# Patient Record
Sex: Female | Born: 1990 | Race: White | Hispanic: No | Marital: Single | Smoking: Never smoker
Health system: Southern US, Community
[De-identification: ages and names within clinical notes are randomized; demographics above are authoritative.]

## PROBLEM LIST (undated history)

## (undated) DIAGNOSIS — D649 Anemia, unspecified: Secondary | ICD-10-CM

## (undated) DIAGNOSIS — D219 Benign neoplasm of connective and other soft tissue, unspecified: Secondary | ICD-10-CM

## (undated) HISTORY — PX: NO PAST SURGERIES: SHX2092

## (undated) HISTORY — DX: Anemia, unspecified: D64.9

---
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
## 2014-11-11 ENCOUNTER — Emergency Department (HOSPITAL_COMMUNITY)
Admission: EM | Admit: 2014-11-11 | Discharge: 2014-11-12 | Disposition: A | Discharge status: Home / Self Care | Payer: Self-pay | Attending: Emergency Medicine | Admitting: Emergency Medicine

## 2014-11-11 ENCOUNTER — Encounter (HOSPITAL_COMMUNITY): Payer: Self-pay | Admitting: Emergency Medicine

## 2014-11-11 DIAGNOSIS — Z8742 Personal history of other diseases of the female genital tract: Secondary | ICD-10-CM | POA: Insufficient documentation

## 2014-11-11 DIAGNOSIS — Z3490 Encounter for supervision of normal pregnancy, unspecified, unspecified trimester: Secondary | ICD-10-CM

## 2014-11-11 DIAGNOSIS — O210 Mild hyperemesis gravidarum: Secondary | ICD-10-CM | POA: Insufficient documentation

## 2014-11-11 DIAGNOSIS — Z3A00 Weeks of gestation of pregnancy not specified: Secondary | ICD-10-CM | POA: Insufficient documentation

## 2014-11-11 DIAGNOSIS — O219 Vomiting of pregnancy, unspecified: Secondary | ICD-10-CM

## 2014-11-11 DIAGNOSIS — R1030 Lower abdominal pain, unspecified: Secondary | ICD-10-CM | POA: Insufficient documentation

## 2014-11-11 HISTORY — DX: Benign neoplasm of connective and other soft tissue, unspecified: D21.9

## 2014-11-11 LAB — PREGNANCY, URINE: Preg Test, Ur: POSITIVE — AB

## 2014-11-11 MED ORDER — ONDANSETRON HCL 4 MG/2ML IJ SOLN
4.0000 mg | Freq: Once | INTRAMUSCULAR | Status: DC
  Filled 2014-11-11: qty 2

## 2014-11-11 NOTE — ED Notes (Signed)
Pelvic supplies at bedside. 

## 2014-11-11 NOTE — ED Notes (Signed)
Patient stated that three days ago she has started feeling severely nausea's. Patient stated that if she does not eat in a certain amount of time that she will get nauseated. Patient says she feel that she rip or tore something in her lower abdomen. She also states that she has a sharp pain there.

## 2014-11-12 LAB — COMPREHENSIVE METABOLIC PANEL
ALT: 14 U/L (ref 0–35)
ANION GAP: 6 (ref 5–15)
AST: 17 U/L (ref 0–37)
Albumin: 3.8 g/dL (ref 3.5–5.2)
Alkaline Phosphatase: 73 U/L (ref 39–117)
BILIRUBIN TOTAL: 0.4 mg/dL (ref 0.3–1.2)
BUN: 11 mg/dL (ref 6–23)
CO2: 22 mmol/L (ref 19–32)
Calcium: 9 mg/dL (ref 8.4–10.5)
Chloride: 105 mmol/L (ref 96–112)
Creatinine, Ser: 0.83 mg/dL (ref 0.50–1.10)
GFR calc Af Amer: >90 mL/min (ref >90)
GFR calc non Af Amer: >90 mL/min (ref >90)
GLUCOSE: 101 mg/dL — AB (ref 70–99)
POTASSIUM: 3.6 mmol/L (ref 3.5–5.1)
Sodium: 133 mmol/L — ABNORMAL LOW (ref 135–145)
Total Protein: 6.8 g/dL (ref 6.0–8.3)

## 2014-11-12 LAB — CBC WITH DIFFERENTIAL/PLATELET
BASOS ABS: 0 10*3/uL (ref 0.0–0.1)
BASOS PCT: 0 % (ref 0–1)
Eosinophils Absolute: 0.2 10*3/uL (ref 0.0–0.7)
Eosinophils Relative: 2 % (ref 0–5)
HCT: 35.5 % — ABNORMAL LOW (ref 36.0–46.0)
Hemoglobin: 11.1 g/dL — ABNORMAL LOW (ref 12.0–15.0)
Lymphocytes Relative: 34 % (ref 12–46)
Lymphs Abs: 2.9 10*3/uL (ref 0.7–4.0)
MCH: 26.2 pg (ref 26.0–34.0)
MCHC: 31.3 g/dL (ref 30.0–36.0)
MCV: 83.7 fL (ref 78.0–100.0)
MONO ABS: 0.6 10*3/uL (ref 0.1–1.0)
MONOS PCT: 7 % (ref 3–12)
NEUTROS ABS: 4.9 10*3/uL (ref 1.7–7.7)
Neutrophils Relative %: 57 % (ref 43–77)
Platelets: 253 10*3/uL (ref 150–400)
RBC: 4.24 MIL/uL (ref 3.87–5.11)
RDW: 15.1 % (ref 11.5–15.5)
WBC: 8.7 10*3/uL (ref 4.0–10.5)

## 2014-11-12 LAB — URINALYSIS, ROUTINE W REFLEX MICROSCOPIC
BILIRUBIN URINE: NEGATIVE
Glucose, UA: NEGATIVE mg/dL
Hgb urine dipstick: NEGATIVE
Ketones, ur: NEGATIVE mg/dL
Nitrite: NEGATIVE
Protein, ur: NEGATIVE mg/dL
Specific Gravity, Urine: 1.024 (ref 1.005–1.030)
Urobilinogen, UA: 0.2 mg/dL (ref 0.0–1.0)
pH: 7.5 (ref 5.0–8.0)

## 2014-11-12 LAB — WET PREP, GENITAL
Trich, Wet Prep: NONE SEEN
Yeast Wet Prep HPF POC: NONE SEEN

## 2014-11-12 LAB — URINE MICROSCOPIC-ADD ON

## 2014-11-12 LAB — HCG, QUANTITATIVE, PREGNANCY: hCG, Beta Chain, Quant, S: 27388 m[IU]/mL — ABNORMAL HIGH (ref <5)

## 2014-11-12 LAB — GC/CHLAMYDIA PROBE AMP (~~LOC~~) NOT AT ARMC
Chlamydia: NEGATIVE
Neisseria Gonorrhea: NEGATIVE

## 2014-11-12 LAB — LIPASE, BLOOD: Lipase: 21 U/L (ref 11–59)

## 2014-11-12 MED ORDER — PRENATAL COMPLETE 14-0.4 MG PO TABS: 1.0000 | ORAL_TABLET | Freq: Every day | ORAL | Status: DC

## 2014-11-12 NOTE — ED Provider Notes (Signed)
CSN: [id number]     Arrival date & time 11/11/14  2151 History   First MD Initiated Contact with Patient 11/11/14 2307     Chief Complaint  Patient presents with  . Abdominal Pain     (Consider location/radiation/quality/duration/timing/severity/associated sxs/prior Treatment) HPI 24 year old female presents to the emergency department with complaints of several months of nausea, worse when she does not eat, constipation and gas.  She reports over the last 3 days.  Nausea has been worse.  She has had some sharp pains in her lower abdomen, which usually relieved by having a bowel movement or passing gas.  Patient reports she has bowel movements every other day to sometimes twice a day.  Stools are formed.  She reports her last menstrual period was December 4.  She is not on birth control.  She is sexually active.  She reports she had a faintly positive pregnancy test about 2 weeks ago.  She does report that she has had some increased urination without burning or pain.  She has slightly more vaginal discharge and she normally does. Past Medical History  Diagnosis Date  . Fibroids    History reviewed. No pertinent past surgical history. History reviewed. No pertinent family history. History  Substance Use Topics  . Smoking status: Never Smoker   . Smokeless tobacco: Never Used  . Alcohol Use: Not on file   OB History    No data available     Review of Systems  See History of Present Illness; otherwise all other systems are reviewed and negative   Allergies  Review of patient's allergies indicates no known allergies.  Home Medications   Prior to Admission medications   Not on File   BP 121/73 mmHg  Pulse 88  Temp(Src) 98.2 F (36.8 C) (Oral)  Resp 18  SpO2 100%  LMP 09/10/2014 (Exact Date) Physical Exam  Constitutional: She is oriented to person, place, and time. She appears well-developed and well-nourished.  HENT:  Head: Normocephalic and atraumatic.  Nose: Nose  normal.  Mouth/Throat: Oropharynx is clear and moist.  Eyes: Conjunctivae and EOM are normal. Pupils are equal, round, and reactive to light.  Neck: Normal range of motion. Neck supple. No JVD present. No tracheal deviation present. No thyromegaly present.  Cardiovascular: Normal rate, regular rhythm, normal heart sounds and intact distal pulses.  Exam reveals no gallop and no friction rub.   No murmur heard. Pulmonary/Chest: Effort normal and breath sounds normal. No stridor. No respiratory distress. She has no wheezes. She has no rales. She exhibits no tenderness.  Abdominal: Soft. Bowel sounds are normal. She exhibits no distension and no mass. There is no tenderness. There is no rebound and no guarding.  Genitourinary:  External genitalia within normal limits Vagina with mild white discharge Cervix  normal negative for cervical motion tenderness Adnexa palpated, no masses or negative for tenderness noted Bladder palpated negative for tenderness Uterus palpated no masses or negative for tenderness.  Uterus is slightly enlarged, consistent with pregnancy    Musculoskeletal: Normal range of motion. She exhibits no edema or tenderness.  Lymphadenopathy:    She has no cervical adenopathy.  Neurological: She is alert and oriented to person, place, and time. She displays normal reflexes. She exhibits normal muscle tone. Coordination normal.  Skin: Skin is warm and dry. No rash noted. No erythema. No pallor.  Psychiatric: She has a normal mood and affect. Her behavior is normal. Judgment and thought content normal.  Nursing note and vitals reviewed.  ED Course  Procedures (including critical care time) Labs Review Labs Reviewed  WET PREP, GENITAL - Abnormal; Notable for the following:    Clue Cells Wet Prep HPF POC RARE (*)    WBC, Wet Prep HPF POC TOO NUMEROUS TO COUNT (*)    All other components within normal limits  PREGNANCY, URINE - Abnormal; Notable for the following:    Preg  Test, Ur POSITIVE (*)    All other components within normal limits  URINALYSIS, ROUTINE W REFLEX MICROSCOPIC - Abnormal; Notable for the following:    APPearance CLOUDY (*)    Leukocytes, UA LARGE (*)    All other components within normal limits  CBC WITH DIFFERENTIAL/PLATELET - Abnormal; Notable for the following:    Hemoglobin 11.1 (*)    HCT 35.5 (*)    All other components within normal limits  COMPREHENSIVE METABOLIC PANEL - Abnormal; Notable for the following:    Sodium 133 (*)    Glucose, Bld 101 (*)    All other components within normal limits  URINE MICROSCOPIC-ADD ON - Abnormal; Notable for the following:    Squamous Epithelial / LPF MANY (*)    Bacteria, UA FEW (*)    All other components within normal limits  HCG, QUANTITATIVE, PREGNANCY - Abnormal; Notable for the following:    hCG, Beta Chain, Quant, S 27388 (*)    All other components within normal limits  URINE CULTURE  LIPASE, BLOOD  HIV ANTIBODY (ROUTINE TESTING)  RPR  GC/CHLAMYDIA PROBE AMP ([place])    Imaging Review No results found.   EKG Interpretation None      MDM   Final diagnoses:  Pregnancy  Nausea/vomiting in pregnancy   24 year old female with positive pregnancy test.  Will proceed with pelvic exam given lower abdominal cramping and pain.  Urinary tract infection possible, but has many epithelial cells and may be a contaminated sample.  As she is not complaining of severe dysuria, will send for culture.    Kalman Drape, MD 11/12/14 [phone]

## 2014-11-12 NOTE — Discharge Instructions (Signed)
Eat small frequent meals to help with nausea in pregnancy.  By your last menstrual period, you are currently [redacted] weeks pregnant.  Your estimated due date is 06/17/2015.  Start taking prenatal vitamins.  No alcohol or tobacco.  No drug use.  Follow-up with an obstetrician of your choice.   First Trimester of Pregnancy The first trimester of pregnancy is from week 1 until the end of week 12 (months 1 through 3). A week after a sperm fertilizes an egg, the egg will implant on the wall of the uterus. This embryo will begin to develop into a baby. Genes from you and your partner are forming the baby. The female genes determine whether the baby is a boy or a girl. At 6-8 weeks, the eyes and face are formed, and the heartbeat can be seen on ultrasound. At the end of 12 weeks, all the baby's organs are formed.  Now that you are pregnant, you will want to do everything you can to have a healthy baby. Two of the most important things are to get good prenatal care and to follow your health care provider's instructions. Prenatal care is all the medical care you receive before the baby's birth. This care will help prevent, find, and treat any problems during the pregnancy and childbirth. BODY CHANGES Your body goes through many changes during pregnancy. The changes vary from woman to woman.   You may gain or lose a couple of pounds at first.  You may feel sick to your stomach (nauseous) and throw up (vomit). If the vomiting is uncontrollable, call your health care provider.  You may tire easily.  You may develop headaches that can be relieved by medicines approved by your health care provider.  You may urinate more often. Painful urination may mean you have a bladder infection.  You may develop heartburn as a result of your pregnancy.  You may develop constipation because certain hormones are causing the muscles that push waste through your intestines to slow down.  You may develop hemorrhoids or swollen,  bulging veins (varicose veins).  Your breasts may begin to grow larger and become tender. Your nipples may stick out more, and the tissue that surrounds them (areola) may become darker.  Your gums may bleed and may be sensitive to brushing and flossing.  Dark spots or blotches (chloasma, mask of pregnancy) may develop on your face. This will likely fade after the baby is born.  Your menstrual periods will stop.  You may have a loss of appetite.  You may develop cravings for certain kinds of food.  You may have changes in your emotions from day to day, such as being excited to be pregnant or being concerned that something may go wrong with the pregnancy and baby.  You may have more vivid and strange dreams.  You may have changes in your hair. These can include thickening of your hair, rapid growth, and changes in texture. Some women also have hair loss during or after pregnancy, or hair that feels dry or thin. Your hair will most likely return to normal after your baby is born. WHAT TO EXPECT AT YOUR PRENATAL VISITS During a routine prenatal visit:  You will be weighed to make sure you and the baby are growing normally.  Your blood pressure will be taken.  Your abdomen will be measured to track your baby's growth.  The fetal heartbeat will be listened to starting around week 10 or 12 of your pregnancy.  Test results  from any previous visits will be discussed. Your health care provider may ask you:  How you are feeling.  If you are feeling the baby move.  If you have had any abnormal symptoms, such as leaking fluid, bleeding, severe headaches, or abdominal cramping.  If you have any questions. Other tests that may be performed during your first trimester include:  Blood tests to find your blood type and to check for the presence of any previous infections. They will also be used to check for low iron levels (anemia) and Rh antibodies. Later in the pregnancy, blood tests for  diabetes will be done along with other tests if problems develop.  Urine tests to check for infections, diabetes, or protein in the urine.  An ultrasound to confirm the proper growth and development of the baby.  An amniocentesis to check for possible genetic problems.  Fetal screens for spina bifida and Down syndrome.  You may need other tests to make sure you and the baby are doing well. HOME CARE INSTRUCTIONS  Medicines  Follow your health care provider's instructions regarding medicine use. Specific medicines may be either safe or unsafe to take during pregnancy.  Take your prenatal vitamins as directed.  If you develop constipation, try taking a stool softener if your health care provider approves. Diet  Eat regular, well-balanced meals. Choose a variety of foods, such as meat or vegetable-based protein, fish, milk and low-fat dairy products, vegetables, fruits, and whole grain breads and cereals. Your health care provider will help you determine the amount of weight gain that is right for you.  Avoid raw meat and uncooked cheese. These carry germs that can cause birth defects in the baby.  Eating four or five small meals rather than three large meals a day may help relieve nausea and vomiting. If you start to feel nauseous, eating a few soda crackers can be helpful. Drinking liquids between meals instead of during meals also seems to help nausea and vomiting.  If you develop constipation, eat more high-fiber foods, such as fresh vegetables or fruit and whole grains. Drink enough fluids to keep your urine clear or pale yellow. Activity and Exercise  Exercise only as directed by your health care provider. Exercising will help you:  Control your weight.  Stay in shape.  Be prepared for labor and delivery.  Experiencing pain or cramping in the lower abdomen or low back is a good sign that you should stop exercising. Check with your health care provider before continuing normal  exercises.  Try to avoid standing for long periods of time. Move your legs often if you must stand in one place for a long time.  Avoid heavy lifting.  Wear low-heeled shoes, and practice good posture.  You may continue to have sex unless your health care provider directs you otherwise. Relief of Pain or Discomfort  Wear a good support bra for breast tenderness.   Take warm sitz baths to soothe any pain or discomfort caused by hemorrhoids. Use hemorrhoid cream if your health care provider approves.   Rest with your legs elevated if you have leg cramps or low back pain.  If you develop varicose veins in your legs, wear support hose. Elevate your feet for 15 minutes, 3-4 times a day. Limit salt in your diet. Prenatal Care  Schedule your prenatal visits by the twelfth week of pregnancy. They are usually scheduled monthly at first, then more often in the last 2 months before delivery.  Write down your  questions. Take them to your prenatal visits.  Keep all your prenatal visits as directed by your health care provider. Safety  Wear your seat belt at all times when driving.  Make a list of emergency phone numbers, including numbers for family, friends, the hospital, and police and fire departments. General Tips  Ask your health care provider for a referral to a local prenatal education class. Begin classes no later than at the beginning of month 6 of your pregnancy.  Ask for help if you have counseling or nutritional needs during pregnancy. Your health care provider can offer advice or refer you to specialists for help with various needs.  Do not use hot tubs, steam rooms, or saunas.  Do not douche or use tampons or scented sanitary pads.  Do not cross your legs for long periods of time.  Avoid cat litter boxes and soil used by cats. These carry germs that can cause birth defects in the baby and possibly loss of the fetus by miscarriage or stillbirth.  Avoid all smoking,  herbs, alcohol, and medicines not prescribed by your health care provider. Chemicals in these affect the formation and growth of the baby.  Schedule a dentist appointment. At home, brush your teeth with a soft toothbrush and be gentle when you floss. SEEK MEDICAL CARE IF:   You have dizziness.  You have mild pelvic cramps, pelvic pressure, or nagging pain in the abdominal area.  You have persistent nausea, vomiting, or diarrhea.  You have a bad smelling vaginal discharge.  You have pain with urination.  You notice increased swelling in your face, hands, legs, or ankles. SEEK IMMEDIATE MEDICAL CARE IF:   You have a fever.  You are leaking fluid from your vagina.  You have spotting or bleeding from your vagina.  You have severe abdominal cramping or pain.  You have rapid weight gain or loss.  You vomit blood or material that looks like coffee grounds.  You are exposed to [place] measles and have never had them.  You are exposed to fifth disease or chickenpox.  You develop a severe headache.  You have shortness of breath.  You have any kind of trauma, such as from a fall or a car accident. Document Released: 09/18/2001 Document Revised: 02/08/2014 Document Reviewed: 08/04/2013 [facility] Patient Information 2015 [place], [place]. This information is not intended to replace advice given to you by your health care provider. Make sure you discuss any questions you have with your health care provider.  Folic Acid in Pregnancy Folic acid is a B vitamin that helps prevent neural tube defects (NTDs). The neural tube is the part of a developing baby that becomes the brain and spinal cord. When the neural tube does not close properly, a baby is born with an NTD. NTDs include spina bifida, hernia of the spinal cord, and the absence of part or all of the brain (anencephaly).  Take folic acid at least 4 weeks before getting pregnant and through the first 3 months of pregnancy. This is when  the neural tube is developing. It is available in most multivitamins, as a folic-acid-only supplement, and in some foods. Taking the right amount of folic acid before conception and during pregnancy lessens the chance of having a baby born with an NTD. Giving folic acid will not affect a neural tube defect if it is already present. DIAGNOSIS   An alpha fetoprotein (AFP) blood or amniotic fluid test will show high levels of the alpha fetoprotein if a  woman is carrying a baby with an NTD. This test is done on all pregnant women in the first trimester.  An ultrasound may detect an NTD. WHAT YOU CAN DO:  Take a multivitamin with at least 0.4 milligrams (400 micrograms) of folic acid daily at least 4 weeks before getting pregnant and through the first 12 weeks of pregnancy.  If you have already had a pregnancy affected by an NTD, take 4 milligrams (4,000 micrograms) of folic acid daily. Take this amount 1 month before you start trying to get pregnant and continue through the first 3 months of pregnancy. If you have a seizure disorder or take medicines to control seizures, tell your maternity care provider. Continue to take your folic acid unless you are told otherwise.  FOLIC ACID IN FOODS Eat a healthy diet that has foods that contain folic acid, the natural form of the vitamin. Such foods include:  Fortified breakfast cereals.  Lentils.  Asparagus.  Spinach.  Organ meats (liver).  Black beans.  Peanuts (eat only if you do not have a peanut allergy).  Broccoli.  Strawberries, oranges.  Orange juice (from concentrate is best).  Enriched breads and pasta.  Romaine lettuce. TALK TO YOUR HEALTH CARE PROVIDER IF:  You are in your first trimester and have high blood sugar.  You are in your first trimester and develop a high fever. In almost all cases, a fetus found to have an NTD will need specialized care that may not be available in all hospitals. Talk to your health care provider  about what is best for you and your baby. Document Released: 09/27/2003 Document Revised: 02/08/2014 Document Reviewed: 12/28/2009 [facility] Patient Information 2015 [place], [place]. This information is not intended to replace advice given to you by your health care provider. Make sure you discuss any questions you have with your health care provider.  How a Baby Grows During Pregnancy Pregnancy begins when the female's sperm enters the female's egg. This happens in the fallopian tube and is called fertilization. The fertilized egg is called an embryo until it reaches 9 weeks from the time of fertilization. From 9 weeks until birth it is called a fetus. The fertilized egg moves down the tube into the uterus and attaches to the inside lining of the uterus.  The pregnant woman is responsible for the growth of the embryo/fetus by supplying nourishment and oxygen through the blood stream and placenta to the developing fetus. The uterus becomes larger and pops out from the abdomen more and more as the fetus develops and grows. A normal pregnancy lasts 280 days, with a range of 259 to 294 days, or 40 weeks. The pregnancy is divided up into three trimesters:  First trimester - 0 to 13 weeks.  Second trimester - 14 to 27 weeks.  Third trimester - 28 to 40 weeks. The day your baby is supposed to be born is called estimated date of confinement [facility]) or estimated date of delivery (EDD). GROWTH OF THE BABY MONTH BY MONTH  First Month: The fertilized egg attaches to the inside of the uterus and certain cells will form the placenta and others will develop into the fetus. The arms, legs, brain, spinal cord, lungs, and heart begin to develop. At the end of the first month the heart begins to beat. The embryo weighs less than an ounce and is  inch long.  Second Month: The bones can be seen, the inner ear, eye lids, hands and feet form and genitals develop. By  the end of 8 weeks, all of the major organs are  developing. The fetus now weighs less than an ounce and is one inch (2.54 cm) long.  Third Month: Teeth buds appear, all the internal organs are forming, bones and muscles begin to grow, the spine can flex and the skin is transparent. Finger and toe nails begin to form, the hands develop faster than the feet and the arms are longer than the legs at this point. The fetus weighs a little more than an ounce (0.03 kg) and is 3 inches (8.89cm) long.  Fourth Month: The placenta is completely formed. The external sex organs, neck, outer ear, eyebrows, eyelids and fingernails are formed. The fetus can hear, swallow, flex its arms and legs and the kidney begins to produce urine. The skin is covered with a white waxy coating (vernix) and very thin hair (lanugo) is present. The fetus weighs 5 ounces (0.14kg) and is 6 to 7 inches (16.51cm) long.  Fifth Month: The fetus moves around more and can be felt for the first time (called quickening), sleeps and wakes up at times, may begin to suck its finger and the nails grow to the end of the fingers. The gallbladder is now functioning and helps to digest the nutrients, eggs are formed in the female and the testicles begin to drop down from the abdomen to the scrotum in the female. The fetus weighs  to 1 pound (0.45kg) and is 10 inches (25.4cm) long.  Sixth Month: The lungs are formed but the fetus does not breath yet. The eyes open, the brain develops more quickly at this time, one can detect finger and toe prints and thicker hair grows. The fetus weighs 1 to 1 pounds (0.68kg) and is 12 inches (30.48cm) long.  Seventh Month: The fetus can hear and respond to sounds, kicks and stretches and can sense changes in light. The fetus weighs 2 to 2 pounds (1.13kg) and is 14 inches (35.56cm) long.  Eight Month: All organs and body systems are fully developed and functioning. The bones get harder, taste buds develop and can taste sweet and sour flavors and the fetus may hiccup  now. Different parts of the brain are developing and the skull remains soft for the brain to grow. The fetus weighs 5 pounds (2.27kg) and is 18 inches (45.75cm) long.  Ninth Month: The fetus gains about a half a pound a week, the lungs are fully developed, patterns of sleep develop and the head moves down into the bottom of the uterus called vertex. If the buttocks moves into the bottom of the uterus, it is called a breech. The fetus weighs 6 to 9 pounds (2.72 to 4.08kg) and is 20 inches (50.8cm) long. You should be informed about your pregnancy, yourself and how the baby is developing as much as possible. Being informed helps you to enjoy this experience. It also gives you the sense to feel if something is not going right and when to ask questions. Talk to your caregiver when you have questions about your baby or your own body. Document Released: 03/12/2008 Document Revised: 12/17/2011 Document Reviewed: 03/12/2008 [facility] Patient Information 2015 [place], [place]. This information is not intended to replace advice given to you by your health care provider. Make sure you discuss any questions you have with your health care provider.  Medicines During Pregnancy During pregnancy, there are medicines that are either safe or unsafe to take. Medicines include prescriptions from your caregiver, over-the-counter medicines, topical creams applied to the  skin, and all herbal substances. Medicines are put into either Class A, B, C, or D. Class A and B medicines have been shown to be safe in pregnancy. Class C medicines are also considered to be safe in pregnancy, but these medicines should only be used when necessary. Class D medicines should not be used at all in pregnancy. They can be harmful to a baby.  It is best to take as little medicine as possible while pregnant. However, some medicines are necessary to take for the mother and baby's health. Sometimes, it is more dangerous to stop taking certain medicines  than to stay on them. This is often the case for people with long-term (chronic) conditions such as asthma, diabetes, or high blood pressure (hypertension). If you are pregnant and have a chronic illness, call your caregiver right away. Bring a list of your medicines and their doses to your appointments. If you are planning to become pregnant, schedule a doctor's appointment and discuss your medicines with your caregiver. Lastly, write down the phone number to your pharmacist. They can answer questions regarding a medicine's class and safety. They cannot give advice as to whether you should or should not be on a medicine.  SAFE AND UNSAFE MEDICINES There is a long list of medicines that are considered safe in pregnancy. Below is a shorter list. For specific medicines, ask your caregiver.  AllergyMedicines Loratadine, cetirizine, and chlorpheniramine are safe to take. Certain nasal steroid sprays are safe. Talk to your caregiver about specific brands that are safe. Analgesics Acetaminophen and acetaminophen with codeine are safe to take. All other nonsteroidal anti-inflammatory drugs (NSAIDS) are not safe. This includes ibuprofen.  Antacids Many over-the-counter antacids are safe to take. Talk to your caregiver about specific brands that are safe. Famotidine, ranitidine, and lansoprazole are safe. Omepresole is considered safe to take in the second trimester. Antibiotic Medicines There are several antibiotics to avoid. These include, but are not limited to, tetracyline, quinolones, and sulfa medications. Talk to your caregiver before taking any antibiotic.  Antihistamines Talk to your caregiver about specific brands that are safe.  Asthma Medicines Most asthma steroid inhalers are safe to take. Talk to your caregiver for specific details. Calcium Calcium supplements are safe to take. Do not take oyster shell calcium.  Cough and Cold Medicines It is safe to take products with guaifenesin or  dextromethorphan. Talk to your caregiver about specific brands that are safe. It is not safe to take products that contain aspirin or ibuprofen. Decongestant Medicines Pseudoephedrine-containing products are safe to take in the second and third trimester.  Depression Medicines Talk about these medicines with your caregiver.  Antidiarrheal Medicines It is safe to take loperamide. Talk to your caregiver about specific brands that are safe. It is not safe to take any antidiarrheal medicine that contains bismuth. Eyedrops Allergy eyedrops should be limited.  Iron It is safe to use certain iron-containing medicines for anemia in pregnancy. They require a prescription.  Antinausea Medicines It is safe to take doxylamine and vitamin B6 as directed. There are other prescription medicines available, if needed.  Sleep aids It is safe to take diphenhydramine and acetaminophen with diphenhydramine.  Steroids Hydrocortisone creams are safe to use as directed. Oral steroids require a prescription. It is not safe to take any hemorrhoid cream with pramoxine or phenylephrine. Stool softener It is safe to take stool softener medicines. Avoid daily or prolonged use of stool softeners. Thyroid Medicine It is important to stay on this thyroid medicine.  It needs to be followed by your caregiver.  Vaginal Medicines Your caregiver will prescribe a medicine to you if you have a vaginal infection. Certain antifungal medicines are safe to use if you have a sexually transmitted infection (STI). Talk to your caregiver.  Document Released: 09/24/2005 Document Revised: 12/17/2011 Document Reviewed: 09/25/2011 [facility] Patient Information 2015 [place], [place]. This information is not intended to replace advice given to you by your health care provider. Make sure you discuss any questions you have with your health care provider.  Morning Sickness Morning sickness is when you feel sick to your stomach (nauseous)  during pregnancy. This nauseous feeling may or may not come with vomiting. It often occurs in the morning but can be a problem any time of day. Morning sickness is most common during the first trimester, but it may continue throughout pregnancy. While morning sickness is unpleasant, it is usually harmless unless you develop severe and continual vomiting (hyperemesis gravidarum). This condition requires more intense treatment.  CAUSES  The cause of morning sickness is not completely known but seems to be related to normal hormonal changes that occur in pregnancy. RISK FACTORS You are at greater risk if you:  Experienced nausea or vomiting before your pregnancy.  Had morning sickness during a previous pregnancy.  Are pregnant with more than one baby, such as twins. TREATMENT  Do not use any medicines (prescription, over-the-counter, or herbal) for morning sickness without first talking to your health care provider. Your health care provider may prescribe or recommend:  Vitamin B6 supplements.  Anti-nausea medicines.  The herbal medicine ginger. HOME CARE INSTRUCTIONS   Only take over-the-counter or prescription medicines as directed by your health care provider.  Taking multivitamins before getting pregnant can prevent or decrease the severity of morning sickness in most women.  Eat a piece of dry toast or unsalted crackers before getting out of bed in the morning.  Eat five or six small meals a day.  Eat dry and bland foods (rice, baked potato). Foods high in carbohydrates are often helpful.  Do not drink liquids with your meals. Drink liquids between meals.  Avoid greasy, fatty, and spicy foods.  Get someone to cook for you if the smell of any food causes nausea and vomiting.  If you feel nauseous after taking prenatal vitamins, take the vitamins at night or with a snack.  Snack on protein foods (nuts, yogurt, cheese) between meals if you are hungry.  Eat unsweetened  gelatins for desserts.  Wearing an acupressure wristband (worn for sea sickness) may be helpful.  Acupuncture may be helpful.  Do not smoke.  Get a humidifier to keep the air in your house free of odors.  Get plenty of fresh air. SEEK MEDICAL CARE IF:   Your home remedies are not working, and you need medicine.  You feel dizzy or lightheaded.  You are losing weight. SEEK IMMEDIATE MEDICAL CARE IF:   You have persistent and uncontrolled nausea and vomiting.  You pass out (faint). MAKE SURE YOU:  Understand these instructions.  Will watch your condition.  Will get help right away if you are not doing well or get worse. Document Released: 11/15/2006 Document Revised: 09/29/2013 Document Reviewed: 03/11/2013 [facility] Patient Information 2015 [place], [place]. This information is not intended to replace advice given to you by your health care provider. Make sure you discuss any questions you have with your health care provider.  Prenatal Care  WHAT IS PRENATAL CARE?  Prenatal care means health care  during your pregnancy, before your baby is born. It is very important to take care of yourself and your baby during your pregnancy by:   Getting early prenatal care. If you know you are pregnant, or think you might be pregnant, call your health care provider as soon as possible. Schedule a visit for a prenatal exam.  Getting regular prenatal care. Follow your health care provider's schedule for blood and other necessary tests. Do not miss appointments.  Doing everything you can to keep yourself and your baby healthy during your pregnancy.  Getting complete care. Prenatal care should include evaluation of the medical, dietary, educational, psychological, and social needs of you and your significant other. The medical and genetic history of your family and the family of your baby's father should be discussed with your health care provider.  Discussing with your health care  provider:  Prescription, over-the-counter, and herbal medicines that you take.  Any history of substance abuse, alcohol use, smoking, and illegal drug use.  Any history of domestic abuse and violence.  Immunizations you have received.  Your nutrition and diet.  The amount of exercise you do.  Any environmental and occupational hazards to which you are exposed.  History of sexually transmitted infections for both you and your partner.  Previous pregnancies you have had. WHY IS PRENATAL CARE SO IMPORTANT?  By regularly seeing your health care provider, you help ensure that problems can be identified early so that they can be treated as soon as possible. Other problems might be prevented. Many studies have shown that early and regular prenatal care is important for the health of mothers and their babies.  HOW CAN I TAKE CARE OF MYSELF WHILE I AM PREGNANT?  Here are ways to take care of yourself and your baby:   Start or continue taking your multivitamin with 400 micrograms (mcg) of folic acid every day.  Get early and regular prenatal care. It is very important to see a health care provider during your pregnancy. Your health care provider will check at each visit to make sure that you and your baby are healthy. If there are any problems, action can be taken right away to help you and your baby.  Eat a healthy diet that includes:  Fruits.  Vegetables.  Foods low in saturated fat.  Whole grains.  Calcium-rich foods, such as milk, yogurt, and hard cheeses.  Drink 6-8 glasses of liquids a day.  Unless your health care provider tells you not to, try to be physically active for 30 minutes, most days of the week. If you are pressed for time, you can get your activity in through 10-minute segments, three times a day.  Do not smoke, drink alcohol, or use drugs. These can cause long-term damage to your baby. Talk with your health care provider about steps to take to stop smoking. Talk  with a member of your faith community, a counselor, a trusted friend, or your health care provider if you are concerned about your alcohol or drug use.  Ask your health care provider before taking any medicine, even over-the-counter medicines. Some medicines are not safe to take during pregnancy.  Get plenty of rest and sleep.  Avoid hot tubs and saunas during pregnancy.  Do not have X-rays taken unless absolutely necessary and with the recommendation of your health care provider. A lead shield can be placed on your abdomen to protect your baby when X-rays are taken in other parts of your body.  Do not  empty the cat litter when you are pregnant. It may contain a parasite that causes an infection called toxoplasmosis, which can cause birth defects. Also, use gloves when working in garden areas used by cats.  Do not eat uncooked or undercooked meats or fish.  Do not eat soft, mold-ripened cheeses (Brie, Camembert, and chevre) or soft, blue-veined cheese (Danish blue and Roquefort).  Stay away from toxic chemicals like:  Insecticides.  Solvents (some cleaners or paint thinners).  Lead.  Mercury.  Sexual intercourse may continue until the end of the pregnancy, unless you have a medical problem or there is a problem with the pregnancy and your health care provider tells you not to.  Do not wear high-heel shoes, especially during the second half of the pregnancy. You can lose your balance and fall.  Do not take long trips, unless absolutely necessary. Be sure to see your health care provider before going on the trip.  Do not sit in one position for more than 2 hours when on a trip.  Take a copy of your medical records when going on a trip. Know where a hospital is located in the city you are visiting, in case of an emergency.  Most dangerous household products will have pregnancy warnings on their labels. Ask your health care provider about products if you are unsure.  Limit or  eliminate your caffeine intake from coffee, tea, sodas, medicines, and chocolate.  Many women continue working through pregnancy. Staying active might help you stay healthier. If you have a question about the safety or the hours you work at your particular job, talk with your health care provider.  Get informed:  Read books.  Watch videos.  Go to childbirth classes for you and your significant other.  Talk with experienced moms.  Ask your health care provider about childbirth education classes for you and your partner. Classes can help you and your partner prepare for the birth of your baby.  Ask about a baby doctor (pediatrician) and methods and pain medicine for labor, delivery, and possible cesarean delivery. HOW OFTEN SHOULD I SEE MY HEALTH CARE PROVIDER DURING PREGNANCY?  Your health care provider will give you a schedule for your prenatal visits. You will have visits more often as you get closer to the end of your pregnancy. An average pregnancy lasts about 40 weeks.  A typical schedule includes visiting your health care provider:   About once each month during your first 6 months of pregnancy.  Every 2 weeks during the next 2 months.  Weekly in the last month, until the delivery date. Your health care provider will probably want to see you more often if:  You are older than 35 years.  Your pregnancy is high risk because you have certain health problems or problems with the pregnancy, such as:  Diabetes.  High blood pressure.  The baby is not growing on schedule, according to the dates of the pregnancy. Your health care provider will do special tests to make sure you and your baby are not having any serious problems. WHAT HAPPENS DURING PRENATAL VISITS?   At your first prenatal visit, your health care provider will do a physical exam and talk to you about your health history and the health history of your partner and your family. Your health care provider will be able to  tell you what date to expect your baby to be born on.  Your first physical exam will include checks of your blood pressure, measurements of  your height and weight, and an exam of your pelvic organs. Your health care provider will do a Pap test if you have not had one recently and will do cultures of your cervix to make sure there is no infection.  At each prenatal visit, there will be tests of your blood, urine, blood pressure, weight, and the progress of the baby will be checked.  At your later prenatal visits, your health care provider will check how you are doing and how your baby is developing. You may have a number of tests done as your pregnancy progresses.  Ultrasound exams are often used to check on your baby's growth and health.  You may have more urine and blood tests, as well as special tests, if needed. These may include amniocentesis to examine fluid in the pregnancy sac, stress tests to check how the baby responds to contractions, or a biophysical profile to measure your baby's well-being. Your health care provider will explain the tests and why they are necessary.  You should be tested for high blood sugar (gestational diabetes) between the 24th and 28th weeks of your pregnancy.  You should discuss with your health care provider your plans to breastfeed or bottle-feed your baby.  Each visit is also a chance for you to learn about staying healthy during pregnancy and to ask questions. Document Released: 09/27/2003 Document Revised: 09/29/2013 Document Reviewed: 12/09/2013 [facility] Patient Information 2015 [place], [place]. This information is not intended to replace advice given to you by your health care provider. Make sure you discuss any questions you have with your health care provider.  Pregnancy, The Father's Role A father has an important role during their partners pregnancy, labor, delivery and afterward. It is important to help and support your partner through this new  period. There are many physical and emotional changes that happen. To be helpful and supportive during this time, you should know and understand what is happening to your partner during the pregnancy, labor, delivery and postpartum period.  PREGNANCY Pregnancy lasts 40 weeks (plus or minus 2 weeks). The pregnancy is divided into three trimesters.   In the first 13 weeks, the mother feels tired, has painful breasts, may feel sick to her stomach (nauseated), throw up (vomit), urinates more often and may have mood changes. All of these changes are normal. If the father is aware of these, he can be more helpful, supportive and understanding. This may include helping with household duties and activities and spending more time with each other.  In the next 14 to 28 weeks, your partner is over the tiredness, nausea and vomiting. She will likely feel better and more energetic. This is the best time of the pregnancy to be more active together, go out more often or take trips. You will be able to see her belly popping out with the pregnancy. You may be able to feel the baby kick.  In the last 12 weeks, she may become more uncomfortable again because her abdomen is popping out more as the baby grows. She may have a hard time doing household chores, her balance may be off, she may have a hard time bending over, tires easily and has a tough time sleeping. At this time, you will realize the birth of your baby is close. You and your partner may have concerns about the safety of your partner and if the baby will be normal and healthy. These are all normal and natural feelings. You should talk with each other and your  caregiver if you have any questions. Attend prenatal care visits with your partner. This is a good time for you to get to know your caregiver, follow the pregnancy and ask questions. Prenatal visits are once a month for 6 months, then they are every 2 weeks for 2 months and then once a week the last month. You  may have more prenatal visits if your caregiver feels it is needed. Your caregiver usually does an ultrasound of the baby at one of the prenatal visits or more often if needed. It is an exciting and emotional to see the baby moving and the heart beating.  Fathers can experience emotional changes during this time as well. These emotions can include happiness, excitement and feeling proud. Fathers may also be concerned about having new responsibilities. These include financial, educational and if it will change the relationship with his partner. These feelings are normal. They should be talked about openly and positively with each other. An important and often asked question is if sexual intercourse is safe during pregnancy and if it will harm the baby. Sexual intercourse is safe unless there is a problem with the pregnancy and your caregiver advises you to not have sexual intercourse. Because physical and emotional changes happen in pregnancy, your partner may not want to have sex during certain times. This is mostly true in the first and third trimesters. Trying different positions may make sexual intercourse comfortable. It is important for the both of you to discuss your feelings and desires with this problem. Talk to your caregiver about any questions you have about sexual intercourse during the pregnancy. LABOR AND DELIVERY There are childbirth classes available for couples to take together. They help you understand what happens during labor and delivery. They also teach you how to help your partner with her labor pains, how to relax, breath properly during a contraction and focus on what is happening during labor. You may be asked to time the contractions, massage her back and breath with her during the contractions. You are also there to see and enjoy the excitement your baby being born. If you have any feelings of fainting or are uncomfortable, tell someone to help you. You may be asked to leave the room  if a problem develops during the labor or delivery. Sometimes a Cesarean Section (C-section) is scheduled or is an emergency during labor and delivery. A C-section is a major operation to deliver the baby. It is done through an incision in the abdomen and uterus. Your partner will be given a medicine to make her sleep (general anesthesia) or spinal anesthesia (numbing the body from the waist down). Most hospitals allow the father in the room for a C-section unless it is an emergency. Recovery from a C-section takes longer, is more uncomfortable and will require more help from the father. AFTER DELIVERY After the baby is born, the mother goes through many changes again. These changes could last 4 to 6 weeks or longer following a C-section. It is not unusual to be anxious, concerned and afraid that you may not be taking care of your newborn baby properly. Your partner may take a while to regain her strength. She may also get feelings of sadness (postpartum blues or depression), which is a more serious condition that may require medical treatment.  Your partner may decide to breastfeed the baby. This helps with bonding between the mother and the baby. Breastfeeding is the best way to feed the baby, but you may feel "left  out." However, you can feel included by burping the baby and bottle feeding the baby with breast milk (collected by the mother) to give your partner some rest. This also helps you to bond with the baby. Breastfeeding mothers can get pregnant even if they are not having menstrual periods. Therefore, some form of birth control should be used if you do not want to get pregnant. Another question and concern is when it is safe to have sexual intercourse again. Usually it takes 4 to 6 weeks for healing to be over with. It may take longer after a C-section. If you have any questions about having sexual intercourse or if it is painful, talk to your caregiver. As a father, you will be adjusting your role  as the baby grows. Fatherhood is a on-going Insurance underwriter. You and your partner should still make time to be together alone and be the couple you were before the baby was born. This is helpful for you, your partner and your baby. As you can see, it is important for a father to be helpful, understanding and supportive during this special time. Document Released: 03/12/2008 Document Revised: 12/17/2011 Document Reviewed: 03/12/2008 [facility] Patient Information 2015 [place], [place]. This information is not intended to replace advice given to you by your health care provider. Make sure you discuss any questions you have with your health care provider.

## 2014-11-13 LAB — URINE CULTURE

## 2014-11-13 LAB — RPR: RPR Ser Ql: NONREACTIVE

## 2014-11-15 LAB — HIV ANTIBODY (ROUTINE TESTING W REFLEX): HIV Screen 4th Generation wRfx: NONREACTIVE

## 2015-11-17 LAB — OB RESULTS CONSOLE GC/CHLAMYDIA
CHLAMYDIA, DNA PROBE: NEGATIVE
Gonorrhea: NEGATIVE

## 2015-11-17 LAB — OB RESULTS CONSOLE RPR: RPR: NONREACTIVE

## 2015-11-17 LAB — OB RESULTS CONSOLE RUBELLA ANTIBODY, IGM: Rubella: IMMUNE

## 2015-11-17 LAB — OB RESULTS CONSOLE HIV ANTIBODY (ROUTINE TESTING): HIV: NONREACTIVE

## 2015-11-17 LAB — OB RESULTS CONSOLE ABO/RH: RH Type: POSITIVE

## 2015-11-17 LAB — OB RESULTS CONSOLE ANTIBODY SCREEN: Antibody Screen: NEGATIVE

## 2015-11-17 LAB — OB RESULTS CONSOLE HEPATITIS B SURFACE ANTIGEN: HEP B S AG: NEGATIVE

## 2016-04-03 ENCOUNTER — Encounter: Payer: Self-pay | Admitting: Hematology and Oncology

## 2016-04-09 ENCOUNTER — Ambulatory Visit (HOSPITAL_BASED_OUTPATIENT_CLINIC_OR_DEPARTMENT_OTHER): Payer: BLUE CROSS/BLUE SHIELD | Admitting: Hematology and Oncology

## 2016-04-09 ENCOUNTER — Telehealth: Payer: Self-pay | Admitting: Hematology and Oncology

## 2016-04-09 ENCOUNTER — Other Ambulatory Visit (HOSPITAL_BASED_OUTPATIENT_CLINIC_OR_DEPARTMENT_OTHER): Payer: BLUE CROSS/BLUE SHIELD

## 2016-04-09 ENCOUNTER — Encounter: Payer: Self-pay | Admitting: Hematology and Oncology

## 2016-04-09 VITALS — BP 114/65 | HR 89 | Temp 98.6°F | Resp 18 | Ht 67.0 in | Wt 247.3 lb

## 2016-04-09 DIAGNOSIS — K909 Intestinal malabsorption, unspecified: Secondary | ICD-10-CM | POA: Diagnosis not present

## 2016-04-09 DIAGNOSIS — D509 Iron deficiency anemia, unspecified: Secondary | ICD-10-CM

## 2016-04-09 DIAGNOSIS — O99013 Anemia complicating pregnancy, third trimester: Secondary | ICD-10-CM | POA: Insufficient documentation

## 2016-04-09 LAB — CBC & DIFF AND RETIC
BASO%: 0.2 % (ref 0.0–2.0)
Basophils Absolute: 0 10*3/uL (ref 0.0–0.1)
EOS ABS: 0.1 10*3/uL (ref 0.0–0.5)
EOS%: 0.7 % (ref 0.0–7.0)
HCT: 30.9 % — ABNORMAL LOW (ref 34.8–46.6)
HGB: 9.5 g/dL — ABNORMAL LOW (ref 11.6–15.9)
IMMATURE RETIC FRACT: 20.4 % — AB (ref 1.60–10.00)
LYMPH#: 1.7 10*3/uL (ref 0.9–3.3)
LYMPH%: 17.2 % (ref 14.0–49.7)
MCH: 23.9 pg — ABNORMAL LOW (ref 25.1–34.0)
MCHC: 30.8 g/dL — ABNORMAL LOW (ref 31.5–36.0)
MCV: 77.7 fL — ABNORMAL LOW (ref 79.5–101.0)
MONO#: 0.6 10*3/uL (ref 0.1–0.9)
MONO%: 6.5 % (ref 0.0–14.0)
NEUT%: 75.4 % (ref 38.4–76.8)
NEUTROS ABS: 7.5 10*3/uL — AB (ref 1.5–6.5)
Platelets: 253 10*3/uL (ref 145–400)
RBC: 3.97 10*6/uL (ref 3.70–5.45)
RDW: 19.7 % — ABNORMAL HIGH (ref 11.2–14.5)
RETIC %: 3.3 % — AB (ref 0.70–2.10)
RETIC CT ABS: 131.01 10*3/uL — AB (ref 33.70–90.70)
WBC: 10 10*3/uL (ref 3.9–10.3)
nRBC: 0 % (ref 0–0)

## 2016-04-09 NOTE — Telephone Encounter (Signed)
Gave and printed appt sched and avs for pt for July and Aug °

## 2016-04-09 NOTE — Progress Notes (Signed)
[facility] CONSULT NOTE  Patient Care Team: No Pcp Per Patient as PCP - General (General Practice) Linda Hedges, DO as Consulting Physician (Obstetrics and Gynecology)  CHIEF COMPLAINTS/PURPOSE OF CONSULTATION:  Iron deficiency anemia, 3rd trimester pregnancy  HISTORY OF PRESENTING ILLNESS:  Loretta Leach 25 y.o. female is here because of severe iron deficiency and currently pregnant  She was found to have abnormal CBC from routine blood count monitoring. Her baseline hemoglobin was around 11.1 in 2016 On 02/16/2016, her CBC showed hemoglobin of 8.8 with MCV of 77.6. This is lower than CBC from 11/17/2015 which showed hemoglobin of 9.1 with MCV of 72.5 She was prescribed oral iron supplements. She is currently pregnant with her first child, 33 weeks pregnancy with expected due date of 05/29/2016. She was told of possibility of cesarean section due to her baby being on the larger side. She denies recent chest pain on exertion, shortness of breath on minimal exertion, pre-syncopal episodes, or palpitations. She had not noticed any recent bleeding such as epistaxis, hematuria or hematochezia. She had history of heavy menstruation due to uterine fibroids prior to pregnancy The patient denies over the counter NSAID ingestion. She is not on antiplatelets agents.  She had no prior history or diagnosis of cancer. Her age appropriate screening programs are up-to-date. She has excessive ice pica and eats a variety of diet. She never donated blood or received blood transfusion The patient was prescribed oral iron supplements and she takes daily with meals. The patient has poor tolerance to oral iron supplement with constipation   MEDICAL HISTORY:  Past Medical History  Diagnosis Date  . Fibroids   . Anemia     SURGICAL HISTORY: History reviewed. No pertinent past surgical history.  SOCIAL HISTORY: Social History   Social History  . Marital Status: Single    Spouse  Name: N/A  . Number of Children: N/A  . Years of Education: N/A   Occupational History  . Not on file.   Social History Main Topics  . Smoking status: Never Smoker   . Smokeless tobacco: Never Used  . Alcohol Use: No  . Drug Use: No  . 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 Not on file   Social History Narrative    FAMILY HISTORY: History reviewed. No pertinent family history.  ALLERGIES:  has No Known Allergies.  MEDICATIONS:  Current Outpatient Prescriptions  Medication Sig Dispense Refill  . Prenatal Vit-Fe Fumarate-FA (PRENATAL COMPLETE) 14-0.4 MG TABS Take 1 tablet by mouth daily. 60 each 0   No current facility-administered medications for this visit.    REVIEW OF SYSTEMS:   Constitutional: Denies fevers, chills or abnormal night sweats Eyes: Denies blurriness of vision, double vision or watery eyes Ears, nose, mouth, throat, and face: Denies mucositis or sore throat Respiratory: Denies cough, dyspnea or wheezes Cardiovascular: Denies palpitation, chest discomfort or lower extremity swelling Gastrointestinal:  Denies nausea, heartburn or change in bowel habits Skin: Denies abnormal skin rashes Lymphatics: Denies new lymphadenopathy or easy bruising Neurological:Denies numbness, tingling or new weaknesses Behavioral/Psych: Mood is stable, no new changes  All other systems were reviewed with the patient and are negative.  PHYSICAL EXAMINATION: ECOG PERFORMANCE STATUS: 0 - Asymptomatic  Filed Vitals:   04/09/16 1354  BP: 114/65  Pulse: 89  Temp: 98.6 F (37 C)  Resp: 18   Filed Weights   04/09/16 1354  Weight: 247 lb 4.8  oz (112.175 kg)    GENERAL:alert, no distress and comfortable SKIN: skin color, texture, turgor are normal, no rashes or significant lesions EYES: normal, conjunctiva are Pale and non-injected, sclera clear OROPHARYNX:no exudate, no erythema and lips, buccal mucosa, and tongue  normal  NECK: supple, thyroid normal size, non-tender, without nodularity LYMPH:  no palpable lymphadenopathy in the cervical, axillary or inguinal LUNGS: clear to auscultation and percussion with normal breathing effort HEART: regular rate & rhythm and no murmurs and no lower extremity edema ABDOMEN:abdomen soft, non-tender and normal bowel sounds. Noted distended uterus consistent with pregnancy Musculoskeletal:no cyanosis of digits and no clubbing  PSYCH: alert & oriented x 3 with fluent speech NEURO: no focal motor/sensory deficits  ASSESSMENT & PLAN:  Iron deficiency anemia The most likely cause of her anemia is due to chronic blood loss/malabsorption syndrome. We discussed some of the risks, benefits, and alternatives of intravenous iron infusions. The patient is symptomatic from anemia and the iron level is critically low. She tolerated oral iron supplement poorly and desires to achieved higher levels of iron faster for adequate hematopoesis. Some of the side-effects to be expected including risks of infusion reactions, phlebitis, headaches, nausea and fatigue.  The patient is willing to proceed. Patient education material was dispensed.  Goal is to keep ferritin level greater than 50 After IV iron, I will see her back next month with repeat blood work. It is not uncommon for some patients to need more than 2 doses of IV iron. The patient desired to breast feed her child in the future and that would also contribute to persistent iron deficiency. I will arrange for fetal monitoring during treatment   Orders Placed This Encounter  Procedures  . CBC & Diff and Retic    Standing Status: Standing     Number of Occurrences: 9     Standing Expiration Date: 04/09/2017  . Ferritin    Standing Status: Standing     Number of Occurrences: 9     Standing Expiration Date: 04/09/2017     All questions were answered. The patient knows to call the clinic with any problems, questions or  concerns. I spent 30 minutes counseling the patient face to face. The total time spent in the appointment was 40 minutes and more than 50% was on counseling.     [facility], Collins Dimaria, MD 04/09/2016 4:03 PM

## 2016-04-09 NOTE — Assessment & Plan Note (Signed)
The most likely cause of her anemia is due to chronic blood loss/malabsorption syndrome. We discussed some of the risks, benefits, and alternatives of intravenous iron infusions. The patient is symptomatic from anemia and the iron level is critically low. She tolerated oral iron supplement poorly and desires to achieved higher levels of iron faster for adequate hematopoesis. Some of the side-effects to be expected including risks of infusion reactions, phlebitis, headaches, nausea and fatigue.  The patient is willing to proceed. Patient education material was dispensed.  Goal is to keep ferritin level greater than 50 After IV iron, I will see her back next month with repeat blood work. It is not uncommon for some patients to need more than 2 doses of IV iron. The patient desired to breast feed her child in the future and that would also contribute to persistent iron deficiency. I will arrange for fetal monitoring during treatment

## 2016-04-11 ENCOUNTER — Other Ambulatory Visit: Payer: Self-pay | Admitting: Hematology and Oncology

## 2016-04-11 ENCOUNTER — Telehealth: Payer: Self-pay | Admitting: *Deleted

## 2016-04-11 LAB — FERRITIN: Ferritin: 10 ng/ml (ref 9–269)

## 2016-04-11 NOTE — Telephone Encounter (Signed)
Rapid Response nurse at [facility] notified of iron infusions at [facility] on 7/11 @ 1230, and 7/18 @ 1230. For fetal monitoring

## 2016-04-17 ENCOUNTER — Ambulatory Visit (HOSPITAL_BASED_OUTPATIENT_CLINIC_OR_DEPARTMENT_OTHER): Payer: BLUE CROSS/BLUE SHIELD

## 2016-04-17 VITALS — BP 109/70 | HR 83 | Temp 98.2°F | Resp 18

## 2016-04-17 DIAGNOSIS — D509 Iron deficiency anemia, unspecified: Secondary | ICD-10-CM

## 2016-04-17 MED ORDER — SODIUM CHLORIDE 0.9 % IV SOLN
Freq: Once | INTRAVENOUS | Status: AC
  Administered 2016-04-17: 13:00:00 via INTRAVENOUS

## 2016-04-17 MED ORDER — SODIUM CHLORIDE 0.9 % IV SOLN
510.0000 mg | Freq: Once | INTRAVENOUS | Status: AC
  Administered 2016-04-17: 510 mg via INTRAVENOUS
  Filled 2016-04-17: qty 17

## 2016-04-17 NOTE — Progress Notes (Signed)
EFM applied for pre iron infusion NST.

## 2016-04-17 NOTE — Patient Instructions (Signed)

## 2016-04-24 ENCOUNTER — Ambulatory Visit (HOSPITAL_BASED_OUTPATIENT_CLINIC_OR_DEPARTMENT_OTHER): Payer: BLUE CROSS/BLUE SHIELD

## 2016-04-24 VITALS — BP 116/78 | HR 98 | Temp 98.5°F | Resp 18

## 2016-04-24 DIAGNOSIS — D509 Iron deficiency anemia, unspecified: Secondary | ICD-10-CM | POA: Diagnosis not present

## 2016-04-24 MED ORDER — SODIUM CHLORIDE 0.9 % IV SOLN
510.0000 mg | Freq: Once | INTRAVENOUS | Status: AC
  Administered 2016-04-24: 510 mg via INTRAVENOUS
  Filled 2016-04-24: qty 17

## 2016-04-24 MED ORDER — SODIUM CHLORIDE 0.9 % IV SOLN
Freq: Once | INTRAVENOUS | Status: AC
  Administered 2016-04-24: 13:00:00 via INTRAVENOUS

## 2016-04-24 NOTE — Progress Notes (Signed)
NST completed. NST reactive and reassuring.  No bleeding or leaking noted.  Pt reports good fetal movement.  Keep appt with Dr Helane Rima next week.

## 2016-04-24 NOTE — Progress Notes (Signed)
Monitors applied for NST during iron transfusion.

## 2016-04-24 NOTE — Patient Instructions (Signed)

## 2016-05-03 LAB — OB RESULTS CONSOLE GBS: STREP GROUP B AG: NEGATIVE

## 2016-05-15 ENCOUNTER — Encounter: Payer: Self-pay | Admitting: Hematology and Oncology

## 2016-05-15 ENCOUNTER — Other Ambulatory Visit (HOSPITAL_BASED_OUTPATIENT_CLINIC_OR_DEPARTMENT_OTHER): Payer: BLUE CROSS/BLUE SHIELD

## 2016-05-15 ENCOUNTER — Telehealth: Payer: Self-pay | Admitting: Hematology and Oncology

## 2016-05-15 ENCOUNTER — Ambulatory Visit (HOSPITAL_BASED_OUTPATIENT_CLINIC_OR_DEPARTMENT_OTHER): Payer: BLUE CROSS/BLUE SHIELD | Admitting: Hematology and Oncology

## 2016-05-15 VITALS — BP 110/67 | HR 88 | Temp 98.2°F | Resp 20 | Wt 247.6 lb

## 2016-05-15 DIAGNOSIS — D509 Iron deficiency anemia, unspecified: Secondary | ICD-10-CM

## 2016-05-15 DIAGNOSIS — D259 Leiomyoma of uterus, unspecified: Secondary | ICD-10-CM | POA: Diagnosis not present

## 2016-05-15 DIAGNOSIS — O99013 Anemia complicating pregnancy, third trimester: Secondary | ICD-10-CM | POA: Diagnosis not present

## 2016-05-15 DIAGNOSIS — D219 Benign neoplasm of connective and other soft tissue, unspecified: Secondary | ICD-10-CM | POA: Insufficient documentation

## 2016-05-15 LAB — CBC & DIFF AND RETIC
BASO%: 0.1 % (ref 0.0–2.0)
BASOS ABS: 0 10*3/uL (ref 0.0–0.1)
EOS%: 0.2 % (ref 0.0–7.0)
Eosinophils Absolute: 0 10*3/uL (ref 0.0–0.5)
HEMATOCRIT: 36.4 % (ref 34.8–46.6)
HGB: 11.3 g/dL — ABNORMAL LOW (ref 11.6–15.9)
IMMATURE RETIC FRACT: 17.3 % — AB (ref 1.60–10.00)
LYMPH%: 21.7 % (ref 14.0–49.7)
MCH: 26.1 pg (ref 25.1–34.0)
MCHC: 31 g/dL — ABNORMAL LOW (ref 31.5–36.0)
MCV: 84.1 fL (ref 79.5–101.0)
MONO#: 0.6 10*3/uL (ref 0.1–0.9)
MONO%: 6.4 % (ref 0.0–14.0)
NEUT#: 6.2 10*3/uL (ref 1.5–6.5)
NEUT%: 71.6 % (ref 38.4–76.8)
PLATELETS: 190 10*3/uL (ref 145–400)
RBC: 4.33 10*6/uL (ref 3.70–5.45)
RDW: 22.7 % — AB (ref 11.2–14.5)
RETIC %: 3.94 % — AB (ref 0.70–2.10)
Retic Ct Abs: 170.6 10*3/uL — ABNORMAL HIGH (ref 33.70–90.70)
WBC: 8.6 10*3/uL (ref 3.9–10.3)
lymph#: 1.9 10*3/uL (ref 0.9–3.3)

## 2016-05-15 NOTE — Assessment & Plan Note (Signed)
She tolerated recent intravenous iron well with rapid improvement of her anemia. The patient plan to breast-feed her baby in the future. Plan to bring her back in 2 months to repeat blood work and give her IV iron in case she needs replacement therapy

## 2016-05-15 NOTE — Assessment & Plan Note (Signed)
She has uterine fibroids which could cause her to have recurrent menorrhagia in the future. I will monitor her blood count carefully in the future

## 2016-05-15 NOTE — Progress Notes (Signed)
[facility] OFFICE PROGRESS NOTE  No PCP Per Patient SUMMARY OF HEMATOLOGIC HISTORY:  Iron deficiency anemia, 3rd trimester pregnancy  HISTORY OF PRESENTING ILLNESS:  Loretta Leach 25 y.o. female is here because of severe iron deficiency and currently pregnant  She was found to have abnormal CBC from routine blood count monitoring. Her baseline hemoglobin was around 11.1 in 2016 On 02/16/2016, her CBC showed hemoglobin of 8.8 with MCV of 77.6. This is lower than CBC from 11/17/2015 which showed hemoglobin of 9.1 with MCV of 72.5 She was prescribed oral iron supplements. She is currently pregnant with her first child, 33 weeks pregnancy with expected due date of 05/29/2016. She was told of possibility of cesarean section due to her baby being on the larger side. She denies recent chest pain on exertion, shortness of breath on minimal exertion, pre-syncopal episodes, or palpitations. She had not noticed any recent bleeding such as epistaxis, hematuria or hematochezia. She had history of heavy menstruation due to uterine fibroids prior to pregnancy The patient denies over the counter NSAID ingestion. She is not on antiplatelets agents.  She had no prior history or diagnosis of cancer. Her age appropriate screening programs are up-to-date. She has excessive ice pica and eats a variety of diet. She never donated blood or received blood transfusion The patient was prescribed oral iron supplements and she takes daily with meals. The patient has poor tolerance to oral iron supplement with constipation  In July 2017, she was given 2 doses of IV iron INTERVAL HISTORY: Donel Brittian 25 y.o. female returns for further follow-up. She feels well. The patient denies any recent signs or symptoms of bleeding such as spontaneous epistaxis, hematuria or hematochezia. She has no reaction to IV iron  I have reviewed the past medical history, past surgical history, social history and  family history with the patient and they are unchanged from previous note.  ALLERGIES:  has No Known Allergies.  MEDICATIONS:  Current Outpatient Prescriptions  Medication Sig Dispense Refill  . Prenatal Vit-Fe Fumarate-FA (PRENATAL COMPLETE) 14-0.4 MG TABS Take 1 tablet by mouth daily. 60 each 0   No current facility-administered medications for this visit.      REVIEW OF SYSTEMS:   Constitutional: Denies fevers, chills or night sweats Eyes: Denies blurriness of vision Ears, nose, mouth, throat, and face: Denies mucositis or sore throat Respiratory: Denies cough, dyspnea or wheezes Cardiovascular: Denies palpitation, chest discomfort or lower extremity swelling Gastrointestinal:  Denies nausea, heartburn or change in bowel habits Skin: Denies abnormal skin rashes Lymphatics: Denies new lymphadenopathy or easy bruising Neurological:Denies numbness, tingling or new weaknesses Behavioral/Psych: Mood is stable, no new changes  All other systems were reviewed with the patient and are negative.  PHYSICAL EXAMINATION: ECOG PERFORMANCE STATUS: 0 - Asymptomatic  Vitals:   05/15/16 1443  BP: 110/67  Pulse: 88  Resp: 20  Temp: 98.2 F (36.8 C)   Filed Weights   05/15/16 1443  Weight: 247 lb 9.6 oz (112.3 kg)    GENERAL:alert, no distress and comfortable SKIN: skin color, texture, turgor are normal, no rashes or significant lesions EYES: normal, Conjunctiva are pink and non-injected, sclera clear Musculoskeletal:no cyanosis of digits and no clubbing  NEURO: alert & oriented x 3 with fluent speech, no focal motor/sensory deficits  LABORATORY DATA:  I have reviewed the data as listed     Component Value Date/Time   NA 133 (L) 11/12/2014 0002   K 3.6 11/12/2014 0002   CL 105 11/12/2014  0002   CO2 22 11/12/2014 0002   GLUCOSE 101 (H) 11/12/2014 0002   BUN 11 11/12/2014 0002   CREATININE 0.83 11/12/2014 0002   CALCIUM 9.0 11/12/2014 0002   PROT 6.8 11/12/2014 0002    ALBUMIN 3.8 11/12/2014 0002   AST 17 11/12/2014 0002   ALT 14 11/12/2014 0002   ALKPHOS 73 11/12/2014 0002   BILITOT 0.4 11/12/2014 0002   GFRNONAA >90 11/12/2014 0002   GFRAA >90 11/12/2014 0002    No results found for: SPEP, UPEP  Lab Results  Component Value Date   WBC 8.6 05/15/2016   NEUTROABS 6.2 05/15/2016   HGB 11.3 (L) 05/15/2016   HCT 36.4 05/15/2016   MCV 84.1 05/15/2016   PLT 190 05/15/2016      Chemistry      Component Value Date/Time   NA 133 (L) 11/12/2014 0002   K 3.6 11/12/2014 0002   CL 105 11/12/2014 0002   CO2 22 11/12/2014 0002   BUN 11 11/12/2014 0002   CREATININE 0.83 11/12/2014 0002      Component Value Date/Time   CALCIUM 9.0 11/12/2014 0002   ALKPHOS 73 11/12/2014 0002   AST 17 11/12/2014 0002   ALT 14 11/12/2014 0002   BILITOT 0.4 11/12/2014 0002      ASSESSMENT & PLAN:  Anemia affecting pregnancy in third trimester, antepartum She tolerated recent intravenous iron well with rapid improvement of her anemia. The patient plan to breast-feed her baby in the future. Plan to bring her back in 2 months to repeat blood work and give her IV iron in case she needs replacement therapy  Fibroids She has uterine fibroids which could cause her to have recurrent menorrhagia in the future. I will monitor her blood count carefully in the future   All questions were answered. The patient knows to call the clinic with any problems, questions or concerns. No barriers to learning was detected.  I spent 10 minutes counseling the patient face to face. The total time spent in the appointment was 15 minutes and more than 50% was on counseling.     Dimitriy Carreras, MD 8/8/20175:10 PM

## 2016-05-15 NOTE — Telephone Encounter (Signed)
Gave pt cal & avs °

## 2016-05-16 LAB — FERRITIN: FERRITIN: 72 ng/mL (ref 9–269)

## 2016-05-24 ENCOUNTER — Inpatient Hospital Stay (HOSPITAL_COMMUNITY): Payer: BLUE CROSS/BLUE SHIELD | Admitting: Anesthesiology

## 2016-05-24 ENCOUNTER — Inpatient Hospital Stay (HOSPITAL_COMMUNITY)
Admission: AD | Admit: 2016-05-24 | Discharge: 2016-05-28 | DRG: 766 | Disposition: A | Discharge status: Home / Self Care | Payer: BLUE CROSS/BLUE SHIELD | Source: Ambulatory Visit | Attending: Obstetrics & Gynecology | Admitting: Obstetrics & Gynecology

## 2016-05-24 ENCOUNTER — Encounter (HOSPITAL_COMMUNITY): Payer: Self-pay

## 2016-05-24 DIAGNOSIS — O4202 Full-term premature rupture of membranes, onset of labor within 24 hours of rupture: Secondary | ICD-10-CM | POA: Diagnosis present

## 2016-05-24 DIAGNOSIS — Z3A39 39 weeks gestation of pregnancy: Secondary | ICD-10-CM | POA: Diagnosis not present

## 2016-05-24 DIAGNOSIS — Z98891 History of uterine scar from previous surgery: Secondary | ICD-10-CM

## 2016-05-24 LAB — CBC
HCT: 36.7 % (ref 36.0–46.0)
HEMOGLOBIN: 11.9 g/dL — AB (ref 12.0–15.0)
MCH: 26.7 pg (ref 26.0–34.0)
MCHC: 32.4 g/dL (ref 30.0–36.0)
MCV: 82.5 fL (ref 78.0–100.0)
PLATELETS: 202 10*3/uL (ref 150–400)
RBC: 4.45 MIL/uL (ref 3.87–5.11)
RDW: 22 % — ABNORMAL HIGH (ref 11.5–15.5)
WBC: 10.5 10*3/uL (ref 4.0–10.5)

## 2016-05-24 LAB — RPR: RPR Ser Ql: NONREACTIVE

## 2016-05-24 LAB — POCT FERN TEST: POCT Fern Test: POSITIVE

## 2016-05-24 LAB — TYPE AND SCREEN
ABO/RH(D): O POS
Antibody Screen: NEGATIVE

## 2016-05-24 LAB — ABO/RH: ABO/RH(D): O POS

## 2016-05-24 MED ORDER — LACTATED RINGERS IV SOLN
INTRAVENOUS | Status: DC
  Administered 2016-05-24: 20:00:00 via INTRAVENOUS
  Administered 2016-05-24: 125 mL/h via INTRAVENOUS
  Administered 2016-05-24: 07:00:00 via INTRAVENOUS
  Administered 2016-05-24: 125 mL/h via INTRAVENOUS
  Administered 2016-05-25 (×2): via INTRAVENOUS

## 2016-05-24 MED ORDER — LIDOCAINE HCL (PF) 1 % IJ SOLN: 30.0000 mL | INTRAMUSCULAR | Status: DC | PRN

## 2016-05-24 MED ORDER — EPHEDRINE 5 MG/ML INJ
10.0000 mg | INTRAVENOUS | Status: DC | PRN
  Administered 2016-05-25: 5 mg via INTRAVENOUS

## 2016-05-24 MED ORDER — DIPHENHYDRAMINE HCL 50 MG/ML IJ SOLN: 12.5000 mg | INTRAMUSCULAR | Status: DC | PRN

## 2016-05-24 MED ORDER — EPHEDRINE 5 MG/ML INJ: 10.0000 mg | INTRAVENOUS | Status: DC | PRN

## 2016-05-24 MED ORDER — OXYTOCIN 40 UNITS IN LACTATED RINGERS INFUSION - SIMPLE MED
1.0000 m[IU]/min | INTRAVENOUS | Status: DC
  Administered 2016-05-24: 2 m[IU]/min via INTRAVENOUS
  Administered 2016-05-24: 4 m[IU]/min via INTRAVENOUS

## 2016-05-24 MED ORDER — CEFAZOLIN SODIUM-DEXTROSE 2-4 GM/100ML-% IV SOLN: 2.0000 g | Freq: Once | INTRAVENOUS | Status: DC

## 2016-05-24 MED ORDER — LACTATED RINGERS IV SOLN: 500.0000 mL | Freq: Once | INTRAVENOUS | Status: DC

## 2016-05-24 MED ORDER — ONDANSETRON HCL 4 MG/2ML IJ SOLN
4.0000 mg | Freq: Four times a day (QID) | INTRAMUSCULAR | Status: DC | PRN
  Administered 2016-05-24: 4 mg via INTRAVENOUS
  Filled 2016-05-24: qty 2

## 2016-05-24 MED ORDER — OXYCODONE-ACETAMINOPHEN 5-325 MG PO TABS: 2.0000 | ORAL_TABLET | ORAL | Status: DC | PRN

## 2016-05-24 MED ORDER — PHENYLEPHRINE 40 MCG/ML (10ML) SYRINGE FOR IV PUSH (FOR BLOOD PRESSURE SUPPORT): 80.0000 ug | PREFILLED_SYRINGE | INTRAVENOUS | Status: DC | PRN

## 2016-05-24 MED ORDER — LIDOCAINE HCL (PF) 1 % IJ SOLN
INTRAMUSCULAR | Status: DC | PRN
  Administered 2016-05-24: 5 mL
  Administered 2016-05-24: 2 mL
  Administered 2016-05-24: 3 mL

## 2016-05-24 MED ORDER — LACTATED RINGERS IV SOLN
500.0000 mL | INTRAVENOUS | Status: DC | PRN
  Administered 2016-05-24: 500 mL via INTRAVENOUS
  Administered 2016-05-24: 250 mL via INTRAVENOUS
  Administered 2016-05-24: 500 mL via INTRAVENOUS
  Administered 2016-05-24: 250 mL via INTRAVENOUS

## 2016-05-24 MED ORDER — OXYCODONE-ACETAMINOPHEN 5-325 MG PO TABS: 1.0000 | ORAL_TABLET | ORAL | Status: DC | PRN

## 2016-05-24 MED ORDER — LACTATED RINGERS IV SOLN
INTRAVENOUS | Status: DC
  Administered 2016-05-24: 20:00:00 via INTRAUTERINE

## 2016-05-24 MED ORDER — BUTORPHANOL TARTRATE 1 MG/ML IJ SOLN
1.0000 mg | INTRAMUSCULAR | Status: DC | PRN
  Administered 2016-05-24 (×2): 1 mg via INTRAVENOUS
  Filled 2016-05-24 (×2): qty 1

## 2016-05-24 MED ORDER — ACETAMINOPHEN 325 MG PO TABS: 650.0000 mg | ORAL_TABLET | ORAL | Status: DC | PRN

## 2016-05-24 MED ORDER — SOD CITRATE-CITRIC ACID 500-334 MG/5ML PO SOLN
30.0000 mL | ORAL | Status: DC | PRN
  Administered 2016-05-25: 30 mL via ORAL
  Filled 2016-05-24: qty 15

## 2016-05-24 MED ORDER — FENTANYL 2.5 MCG/ML BUPIVACAINE 1/10 % EPIDURAL INFUSION (WH - ANES)
14.0000 mL/h | INTRAMUSCULAR | Status: DC | PRN
  Administered 2016-05-24 (×2): 14 mL/h via EPIDURAL
  Filled 2016-05-24 (×2): qty 125

## 2016-05-24 MED ORDER — OXYTOCIN 40 UNITS IN LACTATED RINGERS INFUSION - SIMPLE MED
2.5000 [IU]/h | INTRAVENOUS | Status: DC
  Filled 2016-05-24: qty 1000

## 2016-05-24 MED ORDER — PHENYLEPHRINE 40 MCG/ML (10ML) SYRINGE FOR IV PUSH (FOR BLOOD PRESSURE SUPPORT)
80.0000 ug | PREFILLED_SYRINGE | INTRAVENOUS | Status: DC | PRN
  Filled 2016-05-24: qty 10

## 2016-05-24 MED ORDER — FLEET ENEMA 7-19 GM/118ML RE ENEM: 1.0000 | ENEMA | RECTAL | Status: DC | PRN

## 2016-05-24 MED ORDER — OXYTOCIN BOLUS FROM INFUSION: 500.0000 mL | Freq: Once | INTRAVENOUS | Status: DC

## 2016-05-24 MED ORDER — TERBUTALINE SULFATE 1 MG/ML IJ SOLN: 0.2500 mg | Freq: Once | INTRAMUSCULAR | Status: DC | PRN

## 2016-05-24 NOTE — H&P (Signed)
Loretta Leach is a 25 y.o. female presenting for PROM at 0515 this morning.  She reports mild CTX since that time; not increasing in intensity.  She was 2-3 cm at last SVE check in the office yesterday and is unchanged on admission today.  No VB and active FM.  Antepartum course complicated by S>D with last u/s at [redacted]w[redacted]d EFW 7#7 (92%).  GBS negative.  OB History    Gravida Para Term Preterm AB Living   2       1     SAB TAB Ectopic Multiple Live Births   1             Past Medical History:  Diagnosis Date  . Anemia   . Fibroids    Past Surgical History:  Procedure Laterality Date  . NO PAST SURGERIES     Family History: family history is not on file. Social History:  reports that she has never smoked. She has never used smokeless tobacco. She reports that she does not drink alcohol or use drugs.     Maternal Diabetes: No Genetic Screening: Normal Maternal Ultrasounds/Referrals: Normal Fetal Ultrasounds or other Referrals:  None Maternal Substance Abuse:  No Significant Maternal Medications:  None Significant Maternal Lab Results:  Lab values include: Group B Strep negative Other Comments:  None  ROS Maternal Medical History:  Reason for admission: Rupture of membranes.   Contractions: Onset was 3-5 hours ago.   Frequency: irregular.   Perceived severity is mild.    Fetal activity: Perceived fetal activity is normal.   Last perceived fetal movement was within the past hour.    Prenatal complications: no prenatal complications Prenatal Complications - Diabetes: none.    Dilation: 2.5 Effacement (%): 50 Station: -2 Exam by:: Loretta Leach Blood pressure 124/81, pulse 88, temperature 98.3 F (36.8 C), temperature source Oral, resp. rate 16, height 5\' 7"  (1.702 m), weight 250 lb (113.4 kg), SpO2 100 %. Maternal Exam:  Uterine Assessment: Contraction strength is mild.  Contraction frequency is irregular.   Abdomen: Patient reports no abdominal tenderness. Fundal  height is S>D.   Estimated fetal weight is 9#.       Physical Exam  Constitutional: She is oriented to person, place, and time. She appears well-developed and well-nourished.  GI: Soft. There is no rebound and no guarding.  Neurological: She is alert and oriented to person, place, and time.  Skin: Skin is warm and dry.  Psychiatric: She has a normal mood and affect. Her behavior is normal.    Prenatal labs: ABO, Rh: --/--/PENDING (08/17 [id number]) Antibody: NEG (08/17 [id number]) Rubella: Immune (02/09 0000) RPR: Nonreactive (02/09 0000)  HBsAg: Negative (02/09 0000)  HIV: Non-reactive (02/09 0000)  GBS: Negative (07/27 0000)   Assessment/Plan: 24yo G2P0 at [redacted]w[redacted]d with PROM -If no increase in intensity of CTX, start pitocin at 900 -Epidural if desired -Follow labor curve closely given suspected macrosomia   Loretta Leach 05/24/2016, 8:08 AM

## 2016-05-24 NOTE — MAU Note (Signed)
Pt presents with ROM at 0515, denies pain.

## 2016-05-24 NOTE — Anesthesia Procedure Notes (Signed)
Epidural Patient location during procedure: OB  Staffing Anesthesiologist: Suzette Battiest Performed: anesthesiologist   Preanesthetic Checklist Completed: patient identified, site marked, surgical consent, pre-op evaluation, timeout performed, IV checked, risks and benefits discussed and monitors and equipment checked  Epidural Patient position: sitting Prep: site prepped and draped and DuraPrep Patient monitoring: continuous pulse ox and blood pressure Approach: midline Location: L4-L5 Injection technique: LOR saline  Needle:  Needle type: Tuohy  Needle gauge: 17 G Needle length: 9 cm and 9 Needle insertion depth: 8 cm Catheter type: closed end flexible Catheter size: 19 Gauge Catheter at skin depth: 15 (13cm initially. Advanced to 15 when laid in lat decub position.) cm Test dose: negative  Assessment Events: blood not aspirated, injection not painful, no injection resistance, negative IV test and no paresthesia

## 2016-05-24 NOTE — Anesthesia Pain Management Evaluation Note (Signed)
  [facility] Pain Management Visit Note  Patient: Loretta Leach, 25 y.o., female  "Hello I am a member of the anesthesia team at [facility]. We have an anesthesia team available at all times to provide care throughout the hospital, including epidural management and anesthesia for C-section. I don't know your plan for the delivery whether it a natural birth, water birth, IV sedation, nitrous supplementation, doula or epidural, but we want to meet your pain goals."   1.Was your pain managed to your expectations on prior hospitalizations?   No prior hospitalizations  2.What is your expectation for pain management during this hospitalization?     Epidural and IV pain meds  3.How can we help you reach that goal? IV pain medication, epidural.  Record the patient's initial score and the patient's pain goal.   Pain: 2  Pain Goal: 5 [facility] wants you to be able to say your pain was always managed very well.  [name] 05/24/2016

## 2016-05-24 NOTE — Progress Notes (Signed)
No change of cervix after one more hour of adequate contractions.  Patient agrees with plan to proceed with C/S for arrest of dilatation.  Patient is informed of the risk of bleeding, infection, scarring and damage to surrounding structures.  She is informed of the 1% risk of uterine rupture in subsequent pregnancies.  All questions were answered and the patient wishes to proceed.    Linda Hedges, DO

## 2016-05-24 NOTE — Progress Notes (Signed)
Patient has had adequate MVUs x 2 hours.  No cervical change.  SVE 6/75/-2 with caput.  FHT Cat I at this time.  Recommend C/S secondary to arrest of dilatation.  The patient requests more time.  I will recheck in 1 hour as long as FHT remains reassuring.    Linda Hedges, DO

## 2016-05-24 NOTE — Anesthesia Preprocedure Evaluation (Addendum)
Anesthesia Evaluation  Patient identified by MRN, date of birth, ID band Patient awake    Reviewed: Allergy & Precautions, Patient's Chart, lab work & pertinent test results  Airway Mallampati: II       Dental   Pulmonary neg pulmonary ROS,    Pulmonary exam normal        Cardiovascular negative cardio ROS   Rhythm:Regular Rate:Normal     Neuro/Psych negative neurological ROS     GI/Hepatic negative GI ROS, Neg liver ROS,   Endo/Other  negative endocrine ROS  Renal/GU negative Renal ROS     Musculoskeletal   Abdominal   Peds  Hematology negative hematology ROS (+)   Anesthesia Other Findings   Reproductive/Obstetrics (+) Pregnancy                             Anesthesia Physical Anesthesia Plan  ASA: II  Anesthesia Plan: Epidural   Post-op Pain Management:    Induction:   Airway Management Planned: Natural Airway  Additional Equipment:   Intra-op Plan:   Post-operative Plan:   Informed Consent: I have reviewed the patients History and Physical, chart, labs and discussed the procedure including the risks, benefits and alternatives for the proposed anesthesia with the patient or authorized representative who has indicated his/her understanding and acceptance.     Plan Discussed with:   Anesthesia Plan Comments: (For C/S with existing labor epidural)       Anesthesia Quick Evaluation

## 2016-05-25 ENCOUNTER — Encounter (HOSPITAL_COMMUNITY)
Admission: AD | Disposition: A | Discharge status: Home / Self Care | Payer: Self-pay | Source: Ambulatory Visit | Attending: Obstetrics & Gynecology

## 2016-05-25 ENCOUNTER — Encounter (HOSPITAL_COMMUNITY): Payer: Self-pay

## 2016-05-25 DIAGNOSIS — Z98891 History of uterine scar from previous surgery: Secondary | ICD-10-CM

## 2016-05-25 SURGERY — Surgical Case
Anesthesia: Epidural

## 2016-05-25 MED ORDER — SCOPOLAMINE 1 MG/3DAYS TD PT72
MEDICATED_PATCH | TRANSDERMAL | Status: DC | PRN
  Administered 2016-05-25: 1 via TRANSDERMAL

## 2016-05-25 MED ORDER — KETOROLAC TROMETHAMINE 30 MG/ML IJ SOLN: 30.0000 mg | Freq: Once | INTRAMUSCULAR | Status: DC

## 2016-05-25 MED ORDER — LACTATED RINGERS IV SOLN
INTRAVENOUS | Status: DC
  Administered 2016-05-25: 12:00:00 via INTRAVENOUS

## 2016-05-25 MED ORDER — COCONUT OIL OIL
1.0000 "application " | TOPICAL_OIL | Status: DC | PRN
  Administered 2016-05-26: 1 via TOPICAL
  Filled 2016-05-25: qty 120

## 2016-05-25 MED ORDER — KETOROLAC TROMETHAMINE 30 MG/ML IJ SOLN
INTRAMUSCULAR | Status: AC
  Filled 2016-05-25: qty 1

## 2016-05-25 MED ORDER — WITCH HAZEL-GLYCERIN EX PADS: 1.0000 "application " | MEDICATED_PAD | CUTANEOUS | Status: DC | PRN

## 2016-05-25 MED ORDER — SENNOSIDES-DOCUSATE SODIUM 8.6-50 MG PO TABS
2.0000 | ORAL_TABLET | ORAL | Status: DC
  Administered 2016-05-25 – 2016-05-28 (×3): 2 via ORAL
  Filled 2016-05-25 (×3): qty 2

## 2016-05-25 MED ORDER — CEFAZOLIN SODIUM-DEXTROSE 2-4 GM/100ML-% IV SOLN
INTRAVENOUS | Status: AC
  Filled 2016-05-25: qty 100

## 2016-05-25 MED ORDER — OXYCODONE-ACETAMINOPHEN 5-325 MG PO TABS
1.0000 | ORAL_TABLET | ORAL | Status: DC | PRN
  Administered 2016-05-25: 1 via ORAL
  Filled 2016-05-25: qty 1

## 2016-05-25 MED ORDER — DIBUCAINE 1 % RE OINT: 1.0000 "application " | TOPICAL_OINTMENT | RECTAL | Status: DC | PRN

## 2016-05-25 MED ORDER — MORPHINE SULFATE (PF) 0.5 MG/ML IJ SOLN
INTRAMUSCULAR | Status: DC | PRN
  Administered 2016-05-25: 4 mg via EPIDURAL

## 2016-05-25 MED ORDER — ONDANSETRON HCL 4 MG/2ML IJ SOLN
INTRAMUSCULAR | Status: DC | PRN
  Administered 2016-05-25: 4 mg via INTRAVENOUS

## 2016-05-25 MED ORDER — MORPHINE SULFATE (PF) 0.5 MG/ML IJ SOLN
INTRAMUSCULAR | Status: AC
  Filled 2016-05-25: qty 10

## 2016-05-25 MED ORDER — METOCLOPRAMIDE HCL 5 MG/ML IJ SOLN
INTRAMUSCULAR | Status: AC
  Filled 2016-05-25: qty 2

## 2016-05-25 MED ORDER — OXYCODONE-ACETAMINOPHEN 5-325 MG PO TABS
2.0000 | ORAL_TABLET | ORAL | Status: DC | PRN
  Administered 2016-05-25 – 2016-05-28 (×9): 2 via ORAL
  Filled 2016-05-25 (×9): qty 2

## 2016-05-25 MED ORDER — SIMETHICONE 80 MG PO CHEW
80.0000 mg | CHEWABLE_TABLET | ORAL | Status: DC | PRN
  Administered 2016-05-26 – 2016-05-27 (×2): 80 mg via ORAL

## 2016-05-25 MED ORDER — DIPHENHYDRAMINE HCL 25 MG PO CAPS
25.0000 mg | ORAL_CAPSULE | Freq: Four times a day (QID) | ORAL | Status: DC | PRN
Start: 2016-05-25 — End: 2016-05-28
  Administered 2016-05-25 (×2): 25 mg via ORAL
  Filled 2016-05-25 (×2): qty 1

## 2016-05-25 MED ORDER — PROMETHAZINE HCL 25 MG/ML IJ SOLN: 6.2500 mg | INTRAMUSCULAR | Status: DC | PRN

## 2016-05-25 MED ORDER — SIMETHICONE 80 MG PO CHEW
80.0000 mg | CHEWABLE_TABLET | ORAL | Status: DC
  Administered 2016-05-25 – 2016-05-28 (×2): 80 mg via ORAL
  Filled 2016-05-25 (×3): qty 1

## 2016-05-25 MED ORDER — ACETAMINOPHEN 325 MG PO TABS: 650.0000 mg | ORAL_TABLET | ORAL | Status: DC | PRN

## 2016-05-25 MED ORDER — MEPERIDINE HCL 25 MG/ML IJ SOLN
INTRAMUSCULAR | Status: DC | PRN
  Administered 2016-05-25 (×2): 12.5 mg via INTRAVENOUS

## 2016-05-25 MED ORDER — ZOLPIDEM TARTRATE 5 MG PO TABS: 5.0000 mg | ORAL_TABLET | Freq: Every evening | ORAL | Status: DC | PRN

## 2016-05-25 MED ORDER — OXYTOCIN 10 UNIT/ML IJ SOLN
INTRAMUSCULAR | Status: AC
  Filled 2016-05-25: qty 4

## 2016-05-25 MED ORDER — MENTHOL 3 MG MT LOZG: 1.0000 | LOZENGE | OROMUCOSAL | Status: DC | PRN

## 2016-05-25 MED ORDER — SIMETHICONE 80 MG PO CHEW
80.0000 mg | CHEWABLE_TABLET | Freq: Three times a day (TID) | ORAL | Status: DC
  Administered 2016-05-25 – 2016-05-28 (×8): 80 mg via ORAL
  Filled 2016-05-25 (×9): qty 1

## 2016-05-25 MED ORDER — SODIUM CHLORIDE 0.9 % IR SOLN
Status: DC | PRN
  Administered 2016-05-25: 1000 mL

## 2016-05-25 MED ORDER — SODIUM BICARBONATE 8.4 % IV SOLN
INTRAVENOUS | Status: DC | PRN
  Administered 2016-05-25: 5 mL via EPIDURAL
  Administered 2016-05-25: 2 mL via EPIDURAL
  Administered 2016-05-25: 5 mL via EPIDURAL
  Administered 2016-05-25: 3 mL via EPIDURAL
  Administered 2016-05-25: 5 mL via EPIDURAL

## 2016-05-25 MED ORDER — KETOROLAC TROMETHAMINE 30 MG/ML IJ SOLN
30.0000 mg | Freq: Four times a day (QID) | INTRAMUSCULAR | Status: DC | PRN
  Administered 2016-05-25: 30 mg via INTRAMUSCULAR

## 2016-05-25 MED ORDER — PRENATAL MULTIVITAMIN CH
1.0000 | ORAL_TABLET | Freq: Every day | ORAL | Status: DC
  Administered 2016-05-26 – 2016-05-28 (×3): 1 via ORAL
  Filled 2016-05-25 (×4): qty 1

## 2016-05-25 MED ORDER — OXYTOCIN 10 UNIT/ML IJ SOLN
INTRAVENOUS | Status: DC | PRN
  Administered 2016-05-25: 40 [IU] via INTRAVENOUS

## 2016-05-25 MED ORDER — KETOROLAC TROMETHAMINE 30 MG/ML IJ SOLN: 30.0000 mg | Freq: Four times a day (QID) | INTRAMUSCULAR | Status: DC | PRN

## 2016-05-25 MED ORDER — MEPERIDINE HCL 25 MG/ML IJ SOLN
INTRAMUSCULAR | Status: AC
  Filled 2016-05-25: qty 1

## 2016-05-25 MED ORDER — METOCLOPRAMIDE HCL 5 MG/ML IJ SOLN
INTRAMUSCULAR | Status: DC | PRN
  Administered 2016-05-25: 10 mg via INTRAVENOUS

## 2016-05-25 MED ORDER — IBUPROFEN 600 MG PO TABS
600.0000 mg | ORAL_TABLET | Freq: Four times a day (QID) | ORAL | Status: DC
  Administered 2016-05-25 – 2016-05-28 (×11): 600 mg via ORAL
  Filled 2016-05-25 (×13): qty 1

## 2016-05-25 MED ORDER — TETANUS-DIPHTH-ACELL PERTUSSIS 5-2.5-18.5 LF-MCG/0.5 IM SUSP: 0.5000 mL | Freq: Once | INTRAMUSCULAR | Status: DC

## 2016-05-25 MED ORDER — MEPERIDINE HCL 25 MG/ML IJ SOLN: 6.2500 mg | INTRAMUSCULAR | Status: DC | PRN

## 2016-05-25 MED ORDER — SCOPOLAMINE 1 MG/3DAYS TD PT72
MEDICATED_PATCH | TRANSDERMAL | Status: AC
  Filled 2016-05-25: qty 1

## 2016-05-25 MED ORDER — PHENYLEPHRINE HCL 10 MG/ML IJ SOLN
INTRAMUSCULAR | Status: DC | PRN
  Administered 2016-05-25 (×2): 120 ug via INTRAVENOUS
  Administered 2016-05-25: 80 ug via INTRAVENOUS
  Administered 2016-05-25: 120 ug via INTRAVENOUS
  Administered 2016-05-25 (×3): 80 ug via INTRAVENOUS

## 2016-05-25 MED ORDER — DEXAMETHASONE SODIUM PHOSPHATE 4 MG/ML IJ SOLN
INTRAMUSCULAR | Status: AC
  Filled 2016-05-25: qty 1

## 2016-05-25 MED ORDER — HYDROMORPHONE HCL 1 MG/ML IJ SOLN: 0.2500 mg | INTRAMUSCULAR | Status: DC | PRN

## 2016-05-25 MED ORDER — OXYTOCIN 40 UNITS IN LACTATED RINGERS INFUSION - SIMPLE MED: 2.5000 [IU]/h | INTRAVENOUS | Status: AC

## 2016-05-25 MED ORDER — ONDANSETRON HCL 4 MG/2ML IJ SOLN
INTRAMUSCULAR | Status: AC
  Filled 2016-05-25: qty 2

## 2016-05-25 SURGICAL SUPPLY — 34 items
APL SKNCLS STERI-STRIP NONHPOA (GAUZE/BANDAGES/DRESSINGS) ×1
BENZOIN TINCTURE PRP APPL 2/3 (GAUZE/BANDAGES/DRESSINGS) ×2 IMPLANT
CHLORAPREP W/TINT 26ML (MISCELLANEOUS) ×2 IMPLANT
CLAMP CORD UMBIL (MISCELLANEOUS) IMPLANT
CLOSURE STERI STRIP 1/2 X4 (GAUZE/BANDAGES/DRESSINGS) ×1 IMPLANT
CLOTH BEACON ORANGE TIMEOUT ST (SAFETY) ×2 IMPLANT
DRSG OPSITE POSTOP 4X10 (GAUZE/BANDAGES/DRESSINGS) ×2 IMPLANT
ELECT REM PT RETURN 9FT ADLT (ELECTROSURGICAL) ×2
ELECTRODE REM PT RTRN 9FT ADLT (ELECTROSURGICAL) ×1 IMPLANT
EXTRACTOR VACUUM KIWI (MISCELLANEOUS) IMPLANT
GLOVE BIO SURGEON STRL SZ 6 (GLOVE) ×2 IMPLANT
GLOVE BIOGEL PI IND STRL 6 (GLOVE) ×2 IMPLANT
GLOVE BIOGEL PI IND STRL 7.0 (GLOVE) ×1 IMPLANT
GLOVE BIOGEL PI INDICATOR 6 (GLOVE) ×2
GLOVE BIOGEL PI INDICATOR 7.0 (GLOVE) ×1
GOWN STRL REUS W/TWL LRG LVL3 (GOWN DISPOSABLE) ×4 IMPLANT
KIT ABG SYR 3ML LUER SLIP (SYRINGE) ×2 IMPLANT
LIQUID BAND (GAUZE/BANDAGES/DRESSINGS) IMPLANT
NDL HYPO 25X5/8 SAFETYGLIDE (NEEDLE) ×1 IMPLANT
NEEDLE HYPO 25X5/8 SAFETYGLIDE (NEEDLE) ×2 IMPLANT
NS IRRIG 1000ML POUR BTL (IV SOLUTION) ×3 IMPLANT
PACK C SECTION WH (CUSTOM PROCEDURE TRAY) ×2 IMPLANT
PAD OB MATERNITY 4.3X12.25 (PERSONAL CARE ITEMS) ×2 IMPLANT
PENCIL SMOKE EVAC W/HOLSTER (ELECTROSURGICAL) ×2 IMPLANT
STRIP CLOSURE SKIN 1/2X4 (GAUZE/BANDAGES/DRESSINGS) IMPLANT
SUT CHROMIC 0 CTX 36 (SUTURE) ×6 IMPLANT
SUT MON AB 2-0 CT1 27 (SUTURE) ×2 IMPLANT
SUT PDS AB 0 CT1 27 (SUTURE) IMPLANT
SUT PLAIN 0 NONE (SUTURE) IMPLANT
SUT PLAIN 2 0 XLH (SUTURE) ×1 IMPLANT
SUT VIC AB 0 CT1 36 (SUTURE) IMPLANT
SUT VIC AB 4-0 KS 27 (SUTURE) IMPLANT
TOWEL OR 17X24 6PK STRL BLUE (TOWEL DISPOSABLE) ×2 IMPLANT
TRAY FOLEY CATH SILVER 14FR (SET/KITS/TRAYS/PACK) IMPLANT

## 2016-05-25 NOTE — Lactation Note (Signed)
This note was copied from a baby's chart. Lactation Consultation Note  Patient Name: Loretta Leach [id number] Date: 05/25/2016  Mom c/o of pinching with baby at breast. Mom has large nipples and baby not opening mouth wide. Demonstrated jaw massage and suck training. Worked with Mom with positioning and using breast compression. After few attempts baby opened mouth wide and was able to obtain more depth. Mom still has slight discomfort but reported some improvement. Demonstrated how to bring bottom lip down. Baby demonstrated good suckling bursts. Basic teaching reviewed, encouraged to BF with feeding ques. Lactation brochure left for review, advised of OP services and support group. Encouraged to call or assist as needed.    Maternal Data    Feeding Feeding Type: Breast Fed Length of feed: 10 min (on and off)  LATCH Score/Interventions                      Lactation Tools Discussed/Used     Consult Status      Katrine Coho 05/25/2016, 3:20 PM

## 2016-05-25 NOTE — Progress Notes (Signed)
Subjective: Postpartum Day 0: Cesarean Delivery Patient reports tolerating PO.    Objective: Vital signs in last 24 hours: Temp:  [97.8 F (36.6 C)-99 F (37.2 C)] 98.1 F (36.7 C) (08/18 0640) Pulse Rate:  [67-118] 80 (08/18 0640) Resp:  [15-22] 18 (08/18 0640) BP: (102-127)/(51-102) 102/60 (08/18 0640) SpO2:  [98 %-100 %] 100 % (08/18 0640)  Physical Exam:  General: alert and cooperative Lochia: appropriate Uterine Fundus: firm Incision: healing well DVT Evaluation: No evidence of DVT seen on physical exam. Negative Homan's sign. No cords or calf tenderness. No significant calf/ankle edema.   Recent Labs  05/24/16 0635  HGB 11.9*  HCT 36.7    Assessment/Plan: Status post Cesarean section. Doing well postoperatively.  Continue current care Plans outpatient circ.  Raylee Adamec G 05/25/2016, 8:10 AM

## 2016-05-25 NOTE — Progress Notes (Signed)
In to assess patient and give bed bath with perineal care. Infant became hungry and assisted with latch, patient states she will call out if she needs assistance.

## 2016-05-25 NOTE — Lactation Note (Signed)
This note was copied from a baby's chart. Lactation Consultation Note  Patient Name: Loretta Leach [id number] Date: 05/25/2016 Reason for consult: Follow-up assessment;Breast/nipple pain  Mom called out for assist.  She states the pain with feedings is consistent and she can't tolerate baby on breast. She feels baby is pinching and nipple looks pinched.  Assisted with positioning baby in football hold.  Demonstrated good breast compression for a deeper latch.  Baby latches easily but does not obtain enough depth for comfort.  24 mm nipple shield applied and baby latched deeper and mom comfortable.  Baby nursed actively with audible swallows.  Encouraged mom to use good breast massage during feeding.  Instructed to feed with cues and call for assist/concerns prn.   Maternal Data    Feeding Feeding Type: Breast Fed  LATCH Score/Interventions Latch: Grasps breast easily, tongue down, lips flanged, rhythmical sucking. Intervention(s): Adjust position;Assist with latch;Breast massage;Breast compression  Audible Swallowing: Spontaneous and intermittent Intervention(s): Skin to skin;Hand expression;Alternate breast massage  Type of Nipple: Everted at rest and after stimulation  Comfort (Breast/Nipple): Filling, red/small blisters or bruises, mild/mod discomfort     Hold (Positioning): Assistance needed to correctly position infant at breast and maintain latch. Intervention(s): Breastfeeding basics reviewed;Support Pillows;Position options;Skin to skin  LATCH Score: 8  Lactation Tools Discussed/Used Tools: Nipple Shields Nipple shield size: 24   Consult Status Consult Status: Follow-up Date: 05/26/16    Ave Filter 05/25/2016, 9:09 PM

## 2016-05-25 NOTE — Op Note (Signed)
Loretta Leach PROCEDURE DATE: 05/24/2016 - 05/25/2016  PREOPERATIVE DIAGNOSIS: Intrauterine pregnancy at  [redacted]w[redacted]d weeks gestation, arrest of dilatation  POSTOPERATIVE DIAGNOSIS: The same  PROCEDURE:  Primary Low Transverse Cesarean Section  SURGEON:  Dr. Linda Hedges  INDICATIONS: Loretta Leach is a 25 y.o. G2P0010 at [redacted]w[redacted]d scheduled for cesarean section secondary to arrest of dilatation at 6 centimeters.  The risks of cesarean section discussed with the patient included but were not limited to: bleeding which may require transfusion or reoperation; infection which may require antibiotics; injury to bowel, bladder, ureters or other surrounding organs; injury to the fetus; need for additional procedures including hysterectomy in the event of a life-threatening hemorrhage; placental abnormalities wth subsequent pregnancies, incisional problems, thromboembolic phenomenon and other postoperative/anesthesia complications. The patient concurred with the proposed plan, giving informed written consent for the procedure.    FINDINGS:  Viable female infant in cephalic presentation, APGARs 9,9:  Weight pending  Clear amniotic fluid.  Intact placenta, three vessel cord.  Grossly normal uterus, ovaries and fallopian tubes. .   ANESTHESIA:    Epidural ESTIMATED BLOOD LOSS: 750 ml SPECIMENS: Placenta sent to L&D COMPLICATIONS: None immediate  PROCEDURE IN DETAIL:  The patient received intravenous antibiotics and had sequential compression devices applied to her lower extremities while in the preoperative area.  She was then taken to the operating room where epidural anesthesia was dosed up to surgical level and was found to be adequate. She was then placed in a dorsal supine position with a leftward tilt, and prepped and draped in a sterile manner.  A foley catheter was placed into her bladder and attached to constant gravity.  After an adequate timeout was performed, a Pfannenstiel skin incision was made with  scalpel and carried through to the underlying layer of fascia. The fascia was incised in the midline and this incision was extended bilaterally using the Mayo scissors. Kocher clamps were applied to the superior aspect of the fascial incision and the underlying rectus muscles were dissected off bluntly. A similar process was carried out on the inferior aspect of the facial incision. The rectus muscles were separated in the midline bluntly and the peritoneum was entered bluntly. Bladder flap was created sharply and developed bluntly.  The bladder was protected behind the bladder blade.  A transverse hysterotomy was made with a scalpel and extended bilaterally bluntly. The bladder blade was then removed. The infant was successfully delivered, and cord was clamped and cut and infant was handed over to awaiting neonatology team. Uterine massage was then administered and the placenta delivered intact with three-vessel cord. The uterus was cleared of clot and debris.  The hysterotomy was closed with 0 chromic.  A second imbricating suture of 0-chromic was used to reinforce the incision and aid in hemostasis.  The peritoneum and rectus muscles were noted to be hemostatic and were reapproximated using 3-0 monocryl in a running fashion.  The fascia was closed with 0-Vicryl in a running fashion with good restoration of anatomy.  The subcutaneus tissue was copiously irrigated and reapproximated using three interrupted plain gut stitches.  The skin was closed with 4-0 Vicryl in a subcuticular fashion.  Pt tolerated the procedure will.  All counts were correct x2.  Pt went to the recovery room in stable condition.

## 2016-05-25 NOTE — Anesthesia Postprocedure Evaluation (Signed)
Anesthesia Post Note  Patient: Loretta Leach  Procedure(s) Performed: Procedure(s) (LRB): CESAREAN SECTION (N/A)  Patient location during evaluation: PACU Anesthesia Type: Epidural Level of consciousness: awake Pain management: pain level controlled Vital Signs Assessment: post-procedure vital signs reviewed and stable Respiratory status: spontaneous breathing Cardiovascular status: stable Postop Assessment: no headache, no backache, epidural receding, patient able to bend at knees and no signs of nausea or vomiting Anesthetic complications: no     Last Vitals:  Vitals:   05/25/16 0245 05/25/16 0300  BP: 111/72 105/73  Pulse: 92 80  Resp: 15 16  Temp: 36.8 C     Last Pain:  Vitals:   05/25/16 0245  TempSrc: Oral  PainSc: 1    Pain Goal:                 Delmy Holdren JR,JOHN Sharita Bienaime

## 2016-05-25 NOTE — Addendum Note (Signed)
Addendum  created 05/25/16 0817 by Rayvon Char, CRNA   Sign clinical note

## 2016-05-25 NOTE — Transfer of Care (Signed)
Immediate Anesthesia Transfer of Care Note  Patient: Loretta Leach  Procedure(s) Performed: Procedure(s): CESAREAN SECTION (N/A)  Patient Location: PACU  Anesthesia Type:Epidural  Level of Consciousness: awake, alert  and oriented  Airway & Oxygen Therapy: Patient Spontanous Breathing and Patient connected to nasal cannula oxygen  Post-op Assessment: Report given to RN and Post -op Vital signs reviewed and stable  Post vital signs: Reviewed and stable  Last Vitals:  Vitals:   05/24/16 2333 05/25/16 0001  BP:  109/68  Pulse:  91  Resp: 16   Temp:  36.7 C    Last Pain:  Vitals:   05/25/16 0001  TempSrc: Oral  PainSc:          Complications: No apparent anesthesia complications

## 2016-05-25 NOTE — Progress Notes (Signed)
Patient transferred to my care in room 129. Oriented to room, call bell within reach. Plan of care and Newborn safety sheet reviewed. Patient instructed not to get out of bed without assistance, she verbalizes her understanding.

## 2016-05-25 NOTE — Anesthesia Postprocedure Evaluation (Signed)
Anesthesia Post Note  Patient: Loretta Leach  Procedure(s) Performed: Procedure(s) (LRB): CESAREAN SECTION (N/A)  Patient location during evaluation: Mother Baby Anesthesia Type: Epidural Level of consciousness: awake and alert Pain management: pain level controlled Vital Signs Assessment: post-procedure vital signs reviewed and stable Respiratory status: spontaneous breathing, nonlabored ventilation and respiratory function stable Cardiovascular status: stable Postop Assessment: no headache, no backache, epidural receding and patient able to bend at knees Anesthetic complications: no     Last Vitals:  Vitals:   05/25/16 0530 05/25/16 0640  BP: 104/70 102/60  Pulse: 70 80  Resp: 20 18  Temp: 36.7 C 36.7 C    Last Pain:  Vitals:   05/25/16 0715  TempSrc:   PainSc: 0-No pain   Pain Goal:                 Rayvon Char

## 2016-05-26 LAB — CBC
HCT: 29.9 % — ABNORMAL LOW (ref 36.0–46.0)
HEMOGLOBIN: 9.3 g/dL — AB (ref 12.0–15.0)
MCH: 26.1 pg (ref 26.0–34.0)
MCHC: 31.1 g/dL (ref 30.0–36.0)
MCV: 83.8 fL (ref 78.0–100.0)
Platelets: 178 10*3/uL (ref 150–400)
RBC: 3.57 MIL/uL — AB (ref 3.87–5.11)
RDW: 21.9 % — ABNORMAL HIGH (ref 11.5–15.5)
WBC: 16.7 10*3/uL — AB (ref 4.0–10.5)

## 2016-05-26 LAB — BIRTH TISSUE RECOVERY COLLECTION (PLACENTA DONATION)

## 2016-05-26 LAB — CCBB MATERNAL DONOR DRAW

## 2016-05-26 NOTE — Progress Notes (Signed)
Subjective: Postpartum Day 1: Cesarean Delivery Patient reports tolerating PO.    Objective: Vital signs in last 24 hours: Temp:  [97.5 F (36.4 C)-98.4 F (36.9 C)] 98 F (36.7 C) (08/19 0540) Pulse Rate:  [71-87] 71 (08/19 0540) Resp:  [18-20] 18 (08/19 0540) BP: (95-110)/(56-64) 104/64 (08/19 0540) SpO2:  [96 %-100 %] 100 % (08/18 2230)  Physical Exam:  General: alert Lochia: appropriate Uterine Fundus: firm Incision: healing well DVT Evaluation: No evidence of DVT seen on physical exam.   Recent Labs  05/24/16 0635 05/26/16 0559  HGB 11.9* 9.3*  HCT 36.7 29.9*    Assessment/Plan: Status post Cesarean section. Doing well postoperatively.  Continue current care.  Margarette Asal 05/26/2016, 8:57 AM

## 2016-05-26 NOTE — Lactation Note (Signed)
This note was copied from a baby's chart. Lactation Consultation Note: when I arrived in room , mother attempting to [place] infant with a pacifier. Infant has just fed for 30 min. Appeared to be cluster feeding. Advised mother to check infants diaper.  Infant had a large transitional stool and a wet diaper. Mother placed infant back to breast in football hold. Infant had wide open mouth with the need to slightly adjust lower jaw. Infant sustained latch for 15 mins. And was still feeding when I left the room. Lots of teaching with mother on cue base feeding and cluster feeding. Mother receptive to all teaching.   Patient Name: Loretta Leach [id number] Date: 05/26/2016 Reason for consult: Follow-up assessment   Maternal Data    Feeding Feeding Type: Breast Fed Length of feed: 30 min  LATCH Score/Interventions Latch: Grasps breast easily, tongue down, lips flanged, rhythmical sucking. Intervention(s): Adjust position;Assist with latch  Audible Swallowing: Spontaneous and intermittent Intervention(s): Skin to skin;Alternate breast massage  Type of Nipple: Everted at rest and after stimulation  Comfort (Breast/Nipple): Filling, red/small blisters or bruises, mild/mod discomfort  Problem noted: Mild/Moderate discomfort  Hold (Positioning): Assistance needed to correctly position infant at breast and maintain latch. Intervention(s): Support Pillows;Position options;Skin to skin  LATCH Score: 8  Lactation Tools Discussed/Used     Consult Status Consult Status: Follow-up Date: 05/26/16 Follow-up type: In-patient    Jess Barters [facility] 05/26/2016, 2:56 PM

## 2016-05-26 NOTE — Plan of Care (Signed)
Problem: Skin Integrity: Goal: Demonstration of wound healing without infection will improve When the patient showered her abdominal dressing was saturated with water. I notified Dr. Matthew Saras prior to changing the dressing, replacing the honeycomb dressing using sterile technique. The incision is approximated well. The steri-strips intact, saturated with sero-sanquinous drainage. Patient advised to stand with her back to the shower head the next time that she showers so that she will not get the abdominal dressing overly wet. Patient verbalized understanding of these instructions.

## 2016-05-27 MED ORDER — NALOXONE HCL 2 MG/2ML IJ SOSY
1.0000 ug/kg/h | PREFILLED_SYRINGE | INTRAVENOUS | Status: DC | PRN
  Filled 2016-05-27: qty 2

## 2016-05-27 MED ORDER — NALOXONE HCL 0.4 MG/ML IJ SOLN: 0.4000 mg | INTRAMUSCULAR | Status: DC | PRN

## 2016-05-27 MED ORDER — ONDANSETRON HCL 4 MG/2ML IJ SOLN: 4.0000 mg | Freq: Three times a day (TID) | INTRAMUSCULAR | Status: DC | PRN

## 2016-05-27 MED ORDER — IBUPROFEN 600 MG PO TABS
600.0000 mg | ORAL_TABLET | Freq: Four times a day (QID) | ORAL | Status: DC | PRN
  Administered 2016-05-28: 600 mg via ORAL

## 2016-05-27 MED ORDER — ACETAMINOPHEN 500 MG PO TABS
1000.0000 mg | ORAL_TABLET | Freq: Four times a day (QID) | ORAL | Status: DC
  Administered 2016-05-28 (×2): 1000 mg via ORAL
  Filled 2016-05-27 (×3): qty 2

## 2016-05-27 MED ORDER — NALBUPHINE HCL 10 MG/ML IJ SOLN: 5.0000 mg | Freq: Once | INTRAMUSCULAR | Status: DC | PRN

## 2016-05-27 MED ORDER — SCOPOLAMINE 1 MG/3DAYS TD PT72
1.0000 | MEDICATED_PATCH | Freq: Once | TRANSDERMAL | Status: DC
Start: 2016-05-27 — End: 2016-05-28
  Filled 2016-05-27: qty 1

## 2016-05-27 MED ORDER — SODIUM CHLORIDE 0.9% FLUSH: 3.0000 mL | INTRAVENOUS | Status: DC | PRN

## 2016-05-27 MED ORDER — DIPHENHYDRAMINE HCL 25 MG PO CAPS: 25.0000 mg | ORAL_CAPSULE | ORAL | Status: DC | PRN

## 2016-05-27 MED ORDER — NALBUPHINE HCL 10 MG/ML IJ SOLN: 5.0000 mg | INTRAMUSCULAR | Status: DC | PRN

## 2016-05-27 MED ORDER — DIPHENHYDRAMINE HCL 50 MG/ML IJ SOLN: 12.5000 mg | INTRAMUSCULAR | Status: DC | PRN

## 2016-05-27 NOTE — Lactation Note (Signed)
This note was copied from a baby's chart. Lactation Consultation Note; Mother is breastfeeding well. She says her milk is coming and she wants to do some pumping to soften breast. Mother was fit with a #30-#36 flange. Mother states that the #36 feels better. Mother advised to allow infant to feed 8-12 times in 24 hours. Mother to page for latch assist with the next feeding.   Patient Name: Boy Josanne Omori [id number] Date: 05/27/2016     Maternal Data    Feeding Feeding Type: Breast Milk  LATCH Score/Interventions Latch: Grasps breast easily, tongue down, lips flanged, rhythmical sucking.  Audible Swallowing: A few with stimulation Intervention(s): Skin to skin;Alternate breast massage  Type of Nipple: Everted at rest and after stimulation  Comfort (Breast/Nipple): Filling, red/small blisters or bruises, mild/mod discomfort  Problem noted: Mild/Moderate discomfort  Hold (Positioning): No assistance needed to correctly position infant at breast.  LATCH Score: 8  Lactation Tools Discussed/Used Tools: Nipple Shields Nipple shield size: 24   Consult Status      Jess Barters [place] 05/27/2016, 10:40 AM

## 2016-05-27 NOTE — Plan of Care (Signed)
Problem: Pain Management: Goal: General experience of comfort will improve and pain level will decrease Outcome: Progressing Patient encouraged to keep incisional pain manageable by taking Percocet as needed. Patient has not been ambulating in hallway. I reinforced the importance of ambulating in hallway at least three times a day to promote circulation, healing, and intestinal motility.

## 2016-05-27 NOTE — Progress Notes (Signed)
Subjective: Postpartum Day 2: Cesarean Delivery Patient reports tolerating PO.    Objective: Vital signs in last 24 hours: Temp:  [98.4 F (36.9 C)-98.5 F (36.9 C)] 98.4 F (36.9 C) (08/20 0500) Pulse Rate:  [82] 82 (08/20 0500) Resp:  [18] 18 (08/20 0500) BP: (117-131)/(64-70) 117/70 (08/20 0500)  Physical Exam:  General: alert Lochia: appropriate Uterine Fundus: firm Incision: healing well DVT Evaluation: No evidence of DVT seen on physical exam.   Recent Labs  05/26/16 0559  HGB 9.3*  HCT 29.9*    Assessment/Plan: Status post Cesarean section. Doing well postoperatively.  Continue current care, plan for D/C in AM.  Loretta Leach M 05/27/2016, 8:58 AM

## 2016-05-28 MED ORDER — OXYCODONE-ACETAMINOPHEN 5-325 MG PO TABS: 1.0000 | ORAL_TABLET | Freq: Four times a day (QID) | ORAL | 0 refills | Status: DC | PRN

## 2016-05-28 MED ORDER — IBUPROFEN 600 MG PO TABS: 600.0000 mg | ORAL_TABLET | Freq: Four times a day (QID) | ORAL | 0 refills | Status: DC | PRN

## 2016-05-28 NOTE — Lactation Note (Signed)
This note was copied from a baby's chart. Lactation Consultation Note Moms breast filling. Has some knots to outer ducts. Encouraged to massage breast, apply ice, breast feed and use DEBP. Encouraged to wear supportive bra. Mom has transitional milk. Baby acts very hungry, fussy. Mom has pacifier in mouth, encouraged not to use for 2 weeks. Encouraged to burp baby, baby was given BM in bottle from mom d/t breast pain and couldn't tolerate BF at that time. Mom wanted warm compresses. Discussed filling and engorgement tx. And prevention. Patient Name: Loretta Leach [id number] Date: 05/28/2016 Reason for consult: Follow-up assessment;Breast/nipple pain   Maternal Data    Feeding Feeding Type: Bottle Fed - Breast Milk Nipple Type: Slow - flow Length of feed: 30 min  LATCH Score/Interventions Latch: Repeated attempts needed to sustain latch, nipple held in mouth throughout feeding, stimulation needed to elicit sucking reflex. Intervention(s): Assist with latch  Audible Swallowing: Spontaneous and intermittent  Type of Nipple: Everted at rest and after stimulation Intervention(s): No intervention needed  Comfort (Breast/Nipple): Engorged, cracked, bleeding, large blisters, severe discomfort Problem noted: Engorgment Intervention(s): Ice  Problem noted: Severe discomfort Interventions (Mild/moderate discomfort): Hand massage;Hand expression;Pre-pump if needed;Post-pump Interventions (Severe discomfort): Double electric pum  Hold (Positioning): Assistance needed to correctly position infant at breast and maintain latch. Intervention(s): Breastfeeding basics reviewed;Support Pillows;Position options;Skin to skin  LATCH Score: 7  Lactation Tools Discussed/Used Tools: Pump Breast pump type: Double-Electric Breast Pump Pump Review: Setup, frequency, and cleaning;Milk Storage Initiated by:: RN Date initiated:: 05/28/16   Consult Status Consult Status: Follow-up Date:  05/28/16 Follow-up type: In-patient    Nimrit Kehres, Elta Guadeloupe 05/28/2016, 1:05 AM

## 2016-05-28 NOTE — Discharge Instructions (Signed)
No vaginal entry No operation of automobiles No heavy lifting

## 2016-05-28 NOTE — Discharge Summary (Signed)
Obstetric Discharge Summary Reason for Admission: onset of labor Prenatal Procedures: none Intrapartum Procedures: cesarean: low cervical, transverse Postpartum Procedures: none Complications-Operative and Postpartum: none Hemoglobin  Date Value Ref Range Status  05/26/2016 9.3 (L) 12.0 - 15.0 g/dL Final    Comment:    DELTA CHECK NOTED REPEATED TO VERIFY    HGB  Date Value Ref Range Status  05/15/2016 11.3 (L) 11.6 - 15.9 g/dL Final   HCT  Date Value Ref Range Status  05/26/2016 29.9 (L) 36.0 - 46.0 % Final  05/15/2016 36.4 34.8 - 46.6 % Final    Physical Exam:  General: alert, cooperative and no distress Lochia: appropriate Uterine Fundus: firm Incision: healing well DVT Evaluation: No evidence of DVT seen on physical exam.  Discharge Diagnoses: Term Pregnancy-delivered  Discharge Information: Date: 05/28/2016 Activity: pelvic rest Diet: routine Medications: PNV, Ibuprofen and Percocet Condition: stable Instructions: refer to practice specific booklet Discharge to: home   Newborn Data: Live born female  Birth Weight: 8 lb 12.6 oz (3986 g) APGAR: 9, 9  Home with mother.  Loretta Leach II,Loretta Leach 05/28/2016, 7:51 AM

## 2016-05-28 NOTE — Progress Notes (Signed)
Subjective: Postpartum Day 3: Cesarean Delivery Patient reports tolerating PO, + flatus and no problems voiding.    Objective: Vital signs in last 24 hours: Temp:  [98.2 F (36.8 C)-98.5 F (36.9 C)] 98.2 F (36.8 C) (08/21 0505) Pulse Rate:  [85-86] 85 (08/21 0505) Resp:  [18] 18 (08/21 0505) BP: (124-127)/(76-79) 127/79 (08/21 0505) SpO2:  [100 %] 100 % (08/21 0044)  Physical Exam:  General: alert, cooperative and no distress Lochia: appropriate Uterine Fundus: firm Incision: healing well DVT Evaluation: No evidence of DVT seen on physical exam.   Recent Labs  05/26/16 0559  HGB 9.3*  HCT 29.9*    Assessment/Plan: Status post Cesarean section. Doing well postoperatively.  Discharge home with standard precautions and return to clinic in 4-6 weeks.  Makinzi Prieur II,Karlissa Aron E 05/28/2016, 7:48 AM

## 2016-05-28 NOTE — Lactation Note (Signed)
This note was copied from a baby's chart. Lactation Consultation Note: follow up visit before DC, Mom reports breasts were very hard through the night. Has been nursing and pumping and feeling better now. Has Medela pump for home. Encouraged frequent nursing and to pump for comfort. Added more ice to ice packs and mom placed them on her breasts. Reports baby nursed well and softened left breast. Still has a few knots in right outer areas of breast. Plans to pump again. Baby asleep in bassinet. Mom asking about saving milk- reviewed milk storage. Reports baby was feeding a lot during the night and how to know if he is still hungry. Encouraged to nurse skin to skin and watch for deep sucks and swallows. Encouraged to do some breast massage before latching and breast compression during feedings. Breast should feel softer after nursing. Baby asleep in bassinet at present.  No further questions at present. Reviewed our phone number to call with questions, OP appointments and BFSG.  Patient Name: Loretta Leach [id number] Date: 05/28/2016 Reason for consult: Follow-up assessment   Maternal Data Formula Feeding for Exclusion: Yes Reason for exclusion: Mother's choice to formula and breast feed on admission Has patient been taught Hand Expression?: Yes Does the patient have breastfeeding experience prior to this delivery?: No  Feeding Length of feed: 30 min  LATCH Score/Interventions Latch: Grasps breast easily, tongue down, lips flanged, rhythmical sucking.  Audible Swallowing: A few with stimulation Intervention(s): Skin to skin  Type of Nipple: Everted at rest and after stimulation  Comfort (Breast/Nipple): Filling, red/small blisters or bruises, mild/mod discomfort Intervention(s): Ice  Problem noted: Filling Interventions (Filling): Massage;Frequent nursing;Double electric pump Interventions (Mild/moderate discomfort): Pre-pump if needed Interventions (Severe discomfort): Double electric  pum  Hold (Positioning): No assistance needed to correctly position infant at breast.  LATCH Score: 8  Lactation Tools Discussed/Used WIC Program: No Pump Review: Setup, frequency, and cleaning;Milk Storage   Consult Status Consult Status: Complete    Truddie Crumble 05/28/2016, 11:45 AM

## 2016-05-30 ENCOUNTER — Encounter (HOSPITAL_COMMUNITY): Payer: Self-pay | Admitting: Obstetrics & Gynecology

## 2016-07-17 ENCOUNTER — Other Ambulatory Visit: Payer: BLUE CROSS/BLUE SHIELD

## 2016-07-24 ENCOUNTER — Ambulatory Visit: Payer: BLUE CROSS/BLUE SHIELD | Admitting: Hematology and Oncology

## 2016-07-24 ENCOUNTER — Ambulatory Visit: Payer: BLUE CROSS/BLUE SHIELD

## 2016-09-17 ENCOUNTER — Ambulatory Visit (HOSPITAL_COMMUNITY): Admission: RE | Admit: 2016-09-17 | Payer: BLUE CROSS/BLUE SHIELD | Source: Ambulatory Visit

## 2016-09-20 ENCOUNTER — Ambulatory Visit (HOSPITAL_COMMUNITY)
Admission: RE | Admit: 2016-09-20 | Discharge: 2016-09-20 | Disposition: A | Discharge status: Home / Self Care | Payer: BLUE CROSS/BLUE SHIELD | Source: Ambulatory Visit | Attending: Obstetrics and Gynecology | Admitting: Obstetrics and Gynecology

## 2016-09-20 NOTE — Lactation Note (Signed)
Lactation Consult - baby Loretta Leach and mom Loretta Leach, Loretta Leach and dad present at 80 for [address] [place] appt.  Per mom baby last fed at 12:30 for 10 mins at the breast. Per mom baby is going to 65 months old on 12/18, and the  weight gain has been a struggle. Baby lost at 1st , and then gained, lost , and then gained. At 1st just breast feeding  And then when the weight decreased was feeding at the breast , pumping and supplementing with breast milk.  Last Dr, visit was suggested I supplement with breast and formula and get a lactation visit to find out what the feeding  Problem is last weight was 12/7 Thursday at [facility] - 11.4 oz  Per mom baby is breast feeding 8-10 's times in 24 hours for 15 - 30 mins.  Wets >6 , stools every 5 days, ( brownish yellow ). Per  Mom Pedis aware.  Per mom initially had a lot of milk , decreased down when pumping , and then when I increasing pumping with DEBP ( Medela )  Increased 2-4 oz pumping session.  Mom denies sore nipples and engorgement issues   Please see LC impression/ oral variance/ LC plan below. -  After next Tuesday 12/19 LC F/U is gaining is still and issue - [place] referral to oral specialist to assess tongue mobility , and labial frenulum.   Mother's reason for visit:  78 weight - slow gain  Visit Type: feeding assessment due to slow weight gain  Appointment Notes:  3 - 1/2 months old and lagging behind in weight Left a message 12/8 , attempted to call again , no answer . This appt 12/14 was re- scheduled  Consult:  Initial Lactation Consultant:  Loretta Leach  ________________________________________________________________________ Loretta Leach Name:  Loretta Leach Date of Birth:  05/25/2017 Pediatrician: Loretta Leach - [place] [facility]  Gender:  female Gestational Age: [redacted]w[redacted]d (At Birth) Birth Weight:  8 lb 12.6 oz (3986 g) Weight at Discharge:  Weight: 8 lb 5.5 oz (3785 g)               Date of Discharge:  05/28/2016      [facility] Weights    05/25/16 2340 05/27/16 0103 05/27/16 2343  Weight: 8 lb 9 oz (3884 g) 8 lb 4.3 oz (3750 g) 8 lb 5.5 oz (3785 g)  Last weight taken from location outside of [facility]:  12/7 11.4 oz     Location:Pediatrician's office Weight today:  5044g , 11.19 oz   ________________________________________________________________________  Mother's Name: Loretta Leach Type of delivery:  C/section  Breastfeeding Experience: 1st baby  Maternal Medical Conditions:   Maternal Medications:  PNV   _______________________________________________________________________________________________________________________________________________  Maternal Breast Assessment  Breast:  Full Nipple:  Erect Pain level:  0 Pain interventions:  Expressed breast milk  _______________________________________________________________________ Feeding Assessment/Evaluation  Initial feeding assessment:  Infant's oral assessment:  LC assessed baby's oral cavity with gloved fingers High palate noted, baby gagged easily and it was difficult to assess posterior portion of the tongue.  Baby stretched tongue over gum line short distance. Labial frenulum above gum line , upper lip stretches  Well with exam and when latched at the breast but when latched at the breast the upper lip noted to  Not stayed flanged and baby on the right breast would get very rest less and when the [place] worked  On depth - ( getting him to latch with the sandwich of areola  in his mouth with ease at this age became  Even more restless and pushing back away from mom. After several attempts an aiming the nipple towards  The roof of the mouth baby latched ( right for 10 mins ) and released .  TRANSFERRED 4 ML OFF THE RIGHT - latch score 7  AS for the latch on the left - football , on and off latch , added a NS , and poor depth , removed the NS and relatched and after several  Attempts, baby settled down, swallows increased , especially with breast  compressions. Baby fed 15 mins. Left breast transferred = 52 ml Latch score 8  LC had dad feed from a bottle the rest of the feeding so bottle feeding could be assessed.  Small hospital nipple, baby able to flange lips at the base, and tolerated without gagging and took 28 ml EBm from home.  82 ml Total for feeding ( both breast - 56 ml , 28 ml supplement)    Lactation Impression:  LC felt mom needed a lot of reviewed of the basics of latching , supply and demand.  LC praised mom for her efforts breast feeding and since her milk initially came in well,  There is a good possibility she can build the supply back up .  LC also suspects this baby has never learned what   latching deep at the breast feels like. LC watched mom latch the baby and baby was allowed to nibble his way on to the breast.  Mom had mentioned the baby tends to fall asleep , or get very restless when feeding and releases.  LC also suspects this baby may have and issue with a posterior frenulum. LC stated above it was difficult to  Assess the back of the tongue with a glove finger due to the baby gagging. Also even though the upper lip  Stretches with exam and latch at the breast and bottle, at the breast doesn't stay flanged the way it should.  This baby has a lot of relearning to latch with consistent latches at the breast - see [place] plan of care below.   Lactation plan of CARE:  Feeding goals - protect milk supply through breast feeding and extra pumping  Increase Vassie Loll' Kai wight and working on latch - to sustain latch  Days / evening - feed every 2- 3 hours , nights every 3 hours . LC stressed not going over 3 hours without feeding.  Important - for latching - work on tickling baby's upper lip until he opens wide and latch with breast compressions  Until swallows and consistent feeding pattern sucks and swallows , then intermittent compressions.  Option #1 - breast are full - could feed from both breast - always soften 1st  breast well , before offering 2nd  Supplement if still hungry and if the latch has been restless ( noted at consult and pointed out to mom)  Option #2 - If Vassie Loll' is disorganized with sucking pattern and latch , really restless, give 2 oz from a bottle , then latch  Option #3 - breast feed 20 mins. Supplement 2- 3 oz , post pump  Extra pumping - after am feeding , mid day , after noon, early evening, both breast for  10 -15 mins   F/U LC visit made for 12/19 at 2:30 pm LC [facility]P appt at [facility]  For weight check , feeding assessment, milk supply.  Mom and dad receptive for F/U and  mom seemed relieved she came in for [facility] O/p appt.

## 2016-09-25 ENCOUNTER — Ambulatory Visit (HOSPITAL_COMMUNITY)
Admission: RE | Admit: 2016-09-25 | Discharge: 2016-09-25 | Disposition: A | Discharge status: Home / Self Care | Payer: BLUE CROSS/BLUE SHIELD | Source: Ambulatory Visit | Attending: Obstetrics and Gynecology | Admitting: Obstetrics and Gynecology

## 2016-09-25 DIAGNOSIS — Z029 Encounter for administrative examinations, unspecified: Secondary | ICD-10-CM | POA: Insufficient documentation

## 2016-09-25 NOTE — Lactation Note (Signed)
Lactation Consult for mother Neetu Boka) and Rinaldo Cloud (DOB: [birth year])  Mother's reason for visit: slow weight gain Consult:  Follow-Up from visit on 09-20-16 Lactation Consultant:  Larkin Ina  ________________________________________________________________________ BW: 8# 12.6oz D/c wt: 8# 5.5pz 09-13-16: 11# 4 oz  09-20-16: 11# 1.9oz Today's weight at peds office: 10# 14 oz (manual, not digital scale)  Today's weight (09-25-16) during consult: 11# 5.2oz ________________________________________________________________________  Mother's Name: Zeb Comfort Type of delivery:  C/S Breastfeeding Experience: primip Maternal Medical Conditions:  Overweight Maternal Medications: PNV, has begun drinking lactation smoothies  _______________________________________________________________________  Breastfeeding History (Post Discharge)  Frequency of breastfeeding: six times/day. Four hrs is the longest between feedings  Duration of feeding: 10-30 min  Pumping  Type of pump:  Medela pump in style Frequency: 3 times/week for 10-30 min Volume: 6oz  Bottle feeding Playtex wide nipple or Medela  Bottle 1/day (2oz, usually, but sometimes up to 4 oz)   Infant Intake and Output Assessment Voids: 8 in 24 hrs.  Color:  Clear yellow Stools: 6 n 24 hrs.  Color:  Yellow  ________________________________________________________________________  Maternal Breast Assessment  Breast:  Full Nipple:  Erect  _______________________________________________________________________ Feeding Assessment/Evaluation  Initial feeding assessment:  Infant's oral assessment:  WNL  Attached assessment:  Deep  Lips flanged:  Yes.    Suck assessment:  Nutritive  Pre-feed weight: 5138 g  Post-feed weight: 5200 Amount transferred: 62 ml L breast football hold, 10 minutes or less  Pre-feed weight: 5200 g  Post-feed weight: 5232 Amount transferred: 32 ml R  breast, football hold, About 10 minutes  Pre-feed weight: 5232 g  Post-feed weight: 5236 g Amount transferred: 4 ml L breast football hold (with Mom sitting and walking) 11 min  Total amount transferred: 98 ml  Kaden-Kai has gained 3.3oz over the last 5 days. However, he is still 3# below a target weight of around 14# 5 oz (calculated from a weight gain of 5 oz/week). I do not feel that Mom has low supply & I do not feel that there is an issue w/tongue restriction. I feel that this is an issue of breast management. Parents admit to frequent pacifier use and I feel that Hershal Coria may have the disposition of a baby that is "content to starve."   Kaden-Kai latched w/ease and Mom had no discomfort w/latch. He had a wide open gape & his tongue was seen extended beyond his lower gum while nursing. Hershal Coria does move around a bit when nursing, but I think that is his personality. When he first latched, he was able to transfer about 2 oz in less than 10 minutes. His total intake was 63mL, which does not suggest low milk supply. His feedings were also not prolonged.  If using a target weight of 14# 5oz, his intake needs to be about 27 oz/day. If we use the feedings as reported by his mother, he probably gets about 18-20 oz/day (sometimes 22 oz/day).   Plan 1. Feed him as often as he desires. 2. Try to decrease pacifier use. Even suckling for a few minutes when being soothed can contribute to better weight gain. 3. Work towards an intake of around 27 oz/day. It may take him a few days to get there, but work towards that. Bottle feed him if he doesn't nurse well. 4. Calculate how much he gets/24 hours (using how he did during this consult as a baseline for his feedings at the breast).  5. Return for [facility] visit on Dec 26th  at 4pm. Expect weekly weight checks for the time being.   Resources shared: [facility], our BFSGs, & www.breastfeedingrose.org  Elinor Dodge, RN, IBCLC

## 2016-10-03 ENCOUNTER — Ambulatory Visit (HOSPITAL_COMMUNITY): Admission: RE | Admit: 2016-10-03 | Payer: BLUE CROSS/BLUE SHIELD | Source: Ambulatory Visit

## 2019-07-20 ENCOUNTER — Ambulatory Visit: Payer: Medicaid Other | Admitting: Obstetrics and Gynecology

## 2019-07-20 ENCOUNTER — Other Ambulatory Visit (HOSPITAL_COMMUNITY)
Admission: RE | Admit: 2019-07-20 | Discharge: 2019-07-20 | Disposition: A | Discharge status: Home / Self Care | Payer: Medicaid Other | Source: Ambulatory Visit | Attending: Obstetrics and Gynecology | Admitting: Obstetrics and Gynecology

## 2019-07-20 ENCOUNTER — Encounter: Payer: Self-pay | Admitting: Obstetrics and Gynecology

## 2019-07-20 ENCOUNTER — Other Ambulatory Visit: Payer: Self-pay

## 2019-07-20 VITALS — BP 121/78 | HR 101 | Ht 67.0 in | Wt 280.0 lb

## 2019-07-20 DIAGNOSIS — Z01419 Encounter for gynecological examination (general) (routine) without abnormal findings: Secondary | ICD-10-CM | POA: Diagnosis not present

## 2019-07-20 DIAGNOSIS — Z0000 Encounter for general adult medical examination without abnormal findings: Secondary | ICD-10-CM

## 2019-07-20 MED ORDER — LO LOESTRIN FE 1 MG-10 MCG / 10 MCG PO TABS: 1.0000 | ORAL_TABLET | Freq: Every day | ORAL | 3 refills | Status: DC

## 2019-07-20 NOTE — Patient Instructions (Signed)
Contraception Choices Contraception, also called birth control, refers to methods or devices that prevent pregnancy. Hormonal methods Contraceptive implant  A contraceptive implant is a thin, plastic tube that contains a hormone. It is inserted into the upper part of the arm. It can remain in place for up to 3 years. Progestin-only injections Progestin-only injections are injections of progestin, a synthetic form of the hormone progesterone. They are given every 3 months by a health care provider. Birth control pills  Birth control pills are pills that contain hormones that prevent pregnancy. They must be taken once a day, preferably at the same time each day. Birth control patch  The birth control patch contains hormones that prevent pregnancy. It is placed on the skin and must be changed once a week for three weeks and removed on the fourth week. A prescription is needed to use this method of contraception. Vaginal ring  A vaginal ring contains hormones that prevent pregnancy. It is placed in the vagina for three weeks and removed on the fourth week. After that, the process is repeated with a new ring. A prescription is needed to use this method of contraception. Emergency contraceptive Emergency contraceptives prevent pregnancy after unprotected sex. They come in pill form and can be taken up to 5 days after sex. They work best the sooner they are taken after having sex. Most emergency contraceptives are available without a prescription. This method should not be used as your only form of birth control. Barrier methods Female condom  A female condom is a thin sheath that is worn over the penis during sex. Condoms keep sperm from going inside a woman's body. They can be used with a spermicide to increase their effectiveness. They should be disposed after a single use. Female condom  A female condom is a soft, loose-fitting sheath that is put into the vagina before sex. The condom keeps  sperm from going inside a woman's body. They should be disposed after a single use. Diaphragm  A diaphragm is a soft, dome-shaped barrier. It is inserted into the vagina before sex, along with a spermicide. The diaphragm blocks sperm from entering the uterus, and the spermicide kills sperm. A diaphragm should be left in the vagina for 6-8 hours after sex and removed within 24 hours. A diaphragm is prescribed and fitted by a health care provider. A diaphragm should be replaced every 1-2 years, after giving birth, after gaining more than 15 lb (6.8 kg), and after pelvic surgery. Cervical cap  A cervical cap is a round, soft latex or plastic cup that fits over the cervix. It is inserted into the vagina before sex, along with spermicide. It blocks sperm from entering the uterus. The cap should be left in place for 6-8 hours after sex and removed within 48 hours. A cervical cap must be prescribed and fitted by a health care provider. It should be replaced every 2 years. Sponge  A sponge is a soft, circular piece of polyurethane foam with spermicide on it. The sponge helps block sperm from entering the uterus, and the spermicide kills sperm. To use it, you make it wet and then insert it into the vagina. It should be inserted before sex, left in for at least 6 hours after sex, and removed and thrown away within 30 hours. Spermicides Spermicides are chemicals that kill or block sperm from entering the cervix and uterus. They can come as a cream, jelly, suppository, foam, or tablet. A spermicide should be inserted into  vagina with an applicator at least 10-15 minutes before sex to allow time for it to work. The process must be repeated every time you have sex. Spermicides do not require a prescription. Intrauterine contraception Intrauterine device (IUD) An IUD is a T-shaped device that is put in a woman's uterus. There are two types:  Hormone IUD.This type contains progestin, a synthetic form of the hormone  progesterone. This type can stay in place for 3-5 years.  Copper IUD.This type is wrapped in copper wire. It can stay in place for 10 years.  Permanent methods of contraception Female tubal ligation In this method, a woman's fallopian tubes are sealed, tied, or blocked during surgery to prevent eggs from traveling to the uterus. Hysteroscopic sterilization In this method, a small, flexible insert is placed into each fallopian tube. The inserts cause scar tissue to form in the fallopian tubes and block them, so sperm cannot reach an egg. The procedure takes about 3 months to be effective. Another form of birth control must be used during those 3 months. Female sterilization This is a procedure to tie off the tubes that carry sperm (vasectomy). After the procedure, the man can still ejaculate fluid (semen). Natural planning methods Natural family planning In this method, a couple does not have sex on days when the woman could become pregnant. Calendar method This means keeping track of the length of each menstrual cycle, identifying the days when pregnancy can happen, and not having sex on those days. Ovulation method In this method, a couple avoids sex during ovulation. Symptothermal method This method involves not having sex during ovulation. The woman typically checks for ovulation by watching changes in her temperature and in the consistency of cervical mucus. Post-ovulation method In this method, a couple waits to have sex until after ovulation. Summary  Contraception, also called birth control, means methods or devices that prevent pregnancy.  Hormonal methods of contraception include implants, injections, pills, patches, vaginal rings, and emergency contraceptives.  Barrier methods of contraception can include female condoms, female condoms, diaphragms, cervical caps, sponges, and spermicides.  There are two types of IUDs (intrauterine devices). An IUD can be put in a woman's uterus to  prevent pregnancy for 3-5 years.  Permanent sterilization can be done through a procedure for males, females, or both.  Natural family planning methods involve not having sex on days when the woman could become pregnant. This information is not intended to replace advice given to you by your health care provider. Make sure you discuss any questions you have with your health care provider. Document Released: 09/24/2005 Document Revised: 09/26/2017 Document Reviewed: 10/27/2016 Elsevier Patient Education  2020 [employer]  

## 2019-07-20 NOTE — Progress Notes (Signed)
NGYN patient presents for problem visit today.  Last Pap; 3 yrs ago WNL per pt  LMP:06/14/19 just ending on 07/18/19 Current Contraception: None  STD Screening: Declines  No Family Hx of Breast Cancer   CC: Prolonged bleeding that started off as spotting and got heavy having to change a every 1hr.

## 2019-07-20 NOTE — Progress Notes (Signed)
28 yo P1011 here for annual exam and the evaluation of abnormal uterine bleeding. Patient reports a history of a normal monthly 5 day period until 3 months ago when she skipped 2 months and experienced a 31 day episode of vaginal bleeding. Patient is sexually active without contraception and would welcome a pregnancy if it were to occur. She denies any pelvic pain or abnormal discharge. Patient reports a history of fibroid uterus.   Past Medical History:  Diagnosis Date  . Anemia   . Fibroids    Past Surgical History:  Procedure Laterality Date  . CESAREAN SECTION N/A 05/25/2016   Procedure: CESAREAN SECTION;  Surgeon: Linda Hedges, DO;  Location: [facility];  Service: Obstetrics;  Laterality: N/A;  . NO PAST SURGERIES     No family history on file. Social History   Tobacco Use  . Smoking status: Never Smoker  . Smokeless tobacco: Never Used  Substance Use Topics  . Alcohol use: No  . Drug use: No   ROS See pertinent in HPI  Vitals:   07/20/19 1521  BP: 121/78  Pulse: (!) 101    GENERAL: Well-developed, well-nourished female in no acute distress.  HEENT: Normocephalic, atraumatic. Sclerae anicteric.  NECK: Supple. Normal thyroid.  LUNGS: Clear to auscultation bilaterally.  HEART: Regular rate and rhythm. BREASTS: Symmetric in size. No palpable masses or lymphadenopathy, skin changes, or nipple drainage. ABDOMEN: Soft, nontender, nondistended. No organomegaly. PELVIC: Normal external female genitalia. Vagina is pink and rugated.  Normal discharge. Normal appearing cervix. Bimanual exam limited secondary to body habitus. Uterus size appears normal. No adnexal mass or tenderness. EXTREMITIES: No cyanosis, clubbing, or edema, 2+ distal pulses.  A/P 28 yo with AUB and history of fibroid uterus - pelvic ultrasound ordered - Discussed medical management of AUB with contraception- Patient opted for birth control pills- Rx provided - Pap smear collected - Patient will be  contacted with results - RTC prn

## 2019-07-21 LAB — CYTOLOGY - PAP
Adequacy: ABSENT
Diagnosis: NEGATIVE

## 2019-07-24 LAB — CERVICOVAGINAL ANCILLARY ONLY
Bacterial Vaginitis (gardnerella): POSITIVE — AB
Candida Glabrata: NEGATIVE
Candida Vaginitis: NEGATIVE
Chlamydia: NEGATIVE
Comment: NEGATIVE
Comment: NEGATIVE
Comment: NEGATIVE
Comment: NEGATIVE
Comment: NEGATIVE
Comment: NORMAL
Neisseria Gonorrhea: NEGATIVE
Trichomonas: NEGATIVE

## 2019-07-27 MED ORDER — METRONIDAZOLE 500 MG PO TABS: 500.0000 mg | ORAL_TABLET | Freq: Two times a day (BID) | ORAL | 0 refills | Status: DC

## 2019-07-27 NOTE — Addendum Note (Signed)
Addended by: Mora Bellman on: 07/27/2019 09:12 AM   Modules accepted: Orders

## 2019-07-29 ENCOUNTER — Ambulatory Visit (HOSPITAL_COMMUNITY)
Admission: RE | Admit: 2019-07-29 | Discharge: 2019-07-29 | Disposition: A | Discharge status: Home / Self Care | Payer: Medicaid Other | Source: Ambulatory Visit | Attending: Obstetrics and Gynecology | Admitting: Obstetrics and Gynecology

## 2019-07-29 ENCOUNTER — Other Ambulatory Visit: Payer: Self-pay

## 2019-07-29 DIAGNOSIS — Z01419 Encounter for gynecological examination (general) (routine) without abnormal findings: Secondary | ICD-10-CM | POA: Insufficient documentation

## 2019-07-30 NOTE — Addendum Note (Signed)
Addended by: Mora Bellman on: 07/30/2019 12:18 PM   Modules accepted: Orders

## 2019-09-29 ENCOUNTER — Other Ambulatory Visit: Payer: Self-pay

## 2019-09-29 ENCOUNTER — Ambulatory Visit (HOSPITAL_COMMUNITY)
Admission: RE | Admit: 2019-09-29 | Discharge: 2019-09-29 | Disposition: A | Discharge status: Home / Self Care | Payer: Medicaid Other | Source: Ambulatory Visit | Attending: Obstetrics and Gynecology | Admitting: Obstetrics and Gynecology

## 2019-09-29 DIAGNOSIS — Z01419 Encounter for gynecological examination (general) (routine) without abnormal findings: Secondary | ICD-10-CM | POA: Diagnosis present

## 2019-10-20 ENCOUNTER — Other Ambulatory Visit: Payer: Medicaid Other

## 2020-10-05 ENCOUNTER — Encounter (HOSPITAL_BASED_OUTPATIENT_CLINIC_OR_DEPARTMENT_OTHER): Payer: Self-pay | Admitting: *Deleted

## 2020-10-05 ENCOUNTER — Emergency Department (HOSPITAL_BASED_OUTPATIENT_CLINIC_OR_DEPARTMENT_OTHER)
Admission: EM | Admit: 2020-10-05 | Discharge: 2020-10-05 | Disposition: A | Discharge status: Home / Self Care | Payer: Medicaid Other | Attending: Emergency Medicine | Admitting: Emergency Medicine

## 2020-10-05 ENCOUNTER — Other Ambulatory Visit: Payer: Self-pay

## 2020-10-05 DIAGNOSIS — O2341 Unspecified infection of urinary tract in pregnancy, first trimester: Secondary | ICD-10-CM | POA: Diagnosis not present

## 2020-10-05 DIAGNOSIS — Z3A00 Weeks of gestation of pregnancy not specified: Secondary | ICD-10-CM | POA: Insufficient documentation

## 2020-10-05 DIAGNOSIS — Z3491 Encounter for supervision of normal pregnancy, unspecified, first trimester: Secondary | ICD-10-CM

## 2020-10-05 DIAGNOSIS — R300 Dysuria: Secondary | ICD-10-CM | POA: Diagnosis present

## 2020-10-05 DIAGNOSIS — N390 Urinary tract infection, site not specified: Secondary | ICD-10-CM

## 2020-10-05 LAB — URINALYSIS, ROUTINE W REFLEX MICROSCOPIC
Bilirubin Urine: NEGATIVE
Glucose, UA: NEGATIVE mg/dL
Hgb urine dipstick: NEGATIVE
Ketones, ur: NEGATIVE mg/dL
Nitrite: NEGATIVE
Protein, ur: NEGATIVE mg/dL
Specific Gravity, Urine: 1.025 (ref 1.005–1.030)
pH: 6 (ref 5.0–8.0)

## 2020-10-05 LAB — URINALYSIS, MICROSCOPIC (REFLEX)

## 2020-10-05 LAB — PREGNANCY, URINE: Preg Test, Ur: POSITIVE — AB

## 2020-10-05 MED ORDER — DOXYLAMINE-PYRIDOXINE 10-10 MG PO TBEC: 1.0000 | DELAYED_RELEASE_TABLET | Freq: Two times a day (BID) | ORAL | 0 refills | Status: AC

## 2020-10-05 MED ORDER — COMPLETENATE 29-1 MG PO CHEW: 1.0000 | CHEWABLE_TABLET | Freq: Every day | ORAL | 0 refills | Status: AC

## 2020-10-05 MED ORDER — CEPHALEXIN 500 MG PO CAPS: 500.0000 mg | ORAL_CAPSULE | Freq: Three times a day (TID) | ORAL | 0 refills | Status: DC

## 2020-10-05 MED ORDER — CEPHALEXIN 250 MG PO CAPS
500.0000 mg | ORAL_CAPSULE | Freq: Once | ORAL | Status: AC
  Administered 2020-10-05: 500 mg via ORAL
  Filled 2020-10-05: qty 2

## 2020-10-05 NOTE — ED Triage Notes (Signed)
C/o urine freq and pain x 4 days, positive home preg

## 2020-10-05 NOTE — ED Provider Notes (Signed)
[facility] EMERGENCY DEPARTMENT Provider Note   CSN: [id number] Arrival date & time: 10/05/20  1530   History Chief Complaint  Patient presents with  . Dysuria    Loretta Leach is a 29 y.o. female.  The history is provided by the patient.  Dysuria She has history of anemia and fibroids and comes in with concern of possible pregnancy and also possible UTI.  Last menses was 11/9 and was normal.  The last 4 days, she has noted nausea in the morning if she does not eat right away, urinary frequency with urgency and tenesmus and dysuria.  She denies fever or chills.  She denies any flank pain.  She also states that her urine smells the way it smelled with prior UTI.  She has history of one term pregnancy and one miscarriage.  She had not been using any contraception.  Past Medical History:  Diagnosis Date  . Anemia   . Fibroids     Patient Active Problem List   Diagnosis Date Noted  . S/P cesarean section 05/25/2016  . Indication for care in labor or delivery 05/24/2016  . Fibroids 05/15/2016  . Iron deficiency anemia 04/09/2016  . Anemia affecting pregnancy in third trimester, antepartum 04/09/2016    Past Surgical History:  Procedure Laterality Date  . CESAREAN SECTION N/A 05/25/2016   Procedure: CESAREAN SECTION;  Surgeon: Mitchel Honour, DO;  Location: [facility];  Service: Obstetrics;  Laterality: N/A;  . NO PAST SURGERIES       OB History    Gravida  2   Para  1   Term  1   Preterm      AB  1   Living  1     SAB  1   IAB      Ectopic      Multiple  0   Live Births  1           Family History  Problem Relation Age of Onset  . Endometriosis Mother     Social History   Tobacco Use  . Smoking status: Never Smoker  . Smokeless tobacco: Never Used  Vaping Use  . Vaping Use: Never used  Substance Use Topics  . Alcohol use: No  . Drug use: No    Home Medications Prior to Admission medications   Medication Sig Start  Date End Date Taking? Authorizing Provider  cephALEXin (KEFLEX) 500 MG capsule Take 1 capsule (500 mg total) by mouth 3 (three) times daily. 10/05/20  Yes Dione Booze, MD  Doxylamine-Pyridoxine 10-10 MG TBEC Take 1 tablet by mouth 2 (two) times daily. 10/05/20  Yes Dione Booze, MD  prenatal vitamin w/FE, FA (NATACHEW) 29-1 MG CHEW chewable tablet Chew 1 tablet by mouth daily at 12 noon. 10/05/20  Yes Dione Booze, MD  LO LOESTRIN FE 1 MG-10 MCG / 10 MCG tablet Take 1 tablet by mouth daily. 07/20/19 10/05/20  Constant, Peggy, MD    Allergies    Patient has no known allergies.  Review of Systems   Review of Systems  Genitourinary: Positive for dysuria.  All other systems reviewed and are negative.   Physical Exam Updated Vital Signs BP 125/86 (BP Location: Right Arm)   Pulse 97   Temp 98.5 F (36.9 C) (Oral)   Resp 18   Ht 5\' 7"  (1.702 m)   LMP 08/24/2020   SpO2 100%   BMI 43.85 kg/m   Physical Exam Vitals and nursing note reviewed.  29 year old female, resting comfortably and in no acute distress. Vital signs are normal. Oxygen saturation is 100%, which is normal. Head is normocephalic and atraumatic. PERRLA, EOMI. Oropharynx is clear. Neck is nontender and supple without adenopathy or JVD. Back is nontender and there is no CVA tenderness. Lungs are clear without rales, wheezes, or rhonchi. Chest is nontender. Heart has regular rate and rhythm without murmur. Abdomen is soft, flat, nontender without masses or hepatosplenomegaly and peristalsis is normoactive. Extremities have no cyanosis or edema, full range of motion is present. Skin is warm and dry without rash. Neurologic: Mental status is normal, cranial nerves are intact, there are no motor or sensory deficits.  ED Results / Procedures / Treatments   Labs (all labs ordered are listed, but only abnormal results are displayed) Labs Reviewed  URINALYSIS, ROUTINE W REFLEX MICROSCOPIC - Abnormal; Notable for the  following components:      Result Value   APPearance HAZY (*)    Leukocytes,Ua MODERATE (*)    All other components within normal limits  PREGNANCY, URINE - Abnormal; Notable for the following components:   Preg Test, Ur POSITIVE (*)    All other components within normal limits  URINALYSIS, MICROSCOPIC (REFLEX) - Abnormal; Notable for the following components:   Bacteria, UA MANY (*)    All other components within normal limits   Procedures Procedures  Medications Ordered in ED Medications  cephALEXin (KEFLEX) capsule 500 mg (has no administration in time range)    ED Course  I have reviewed the triage vital signs and the nursing notes.  Pertinent lab results that were available during my care of the patient were reviewed by me and considered in my medical decision making (see chart for details).  MDM Rules/Calculators/A&P Possible UTI, possible pregnancy.  Pregnancy test is positive and urinalysis is significant for 21-50 WBCs and many bacteria.  She is discharged with prescription for cephalexin, prenatal vitamins, doxylamine-pyridoxine and advised to arrange for prenatal care.  Old records are reviewed, and she has no relevant past visits.  Final Clinical Impression(s) / ED Diagnoses Final diagnoses:  Urinary tract infection without hematuria, site unspecified  First trimester pregnancy    Rx / DC Orders ED Discharge Orders         Ordered    cephALEXin (KEFLEX) 500 MG capsule  3 times daily        10/05/20 2306    Doxylamine-Pyridoxine 10-10 MG TBEC  2 times daily        10/05/20 2306    prenatal vitamin w/FE, FA (NATACHEW) 29-1 MG CHEW chewable tablet  Daily        10/05/20 2306           Dione Booze, MD 10/05/20 2313

## 2020-10-05 NOTE — Discharge Instructions (Signed)
Please make arrangements for prenatal care.

## 2020-12-17 IMAGING — US US PELVIS COMPLETE WITH TRANSVAGINAL
1 series · 15 of 25 positions shown · non-contrast
Comparison: None

CLINICAL DATA: Abnormal uterine bleeding, fibroids



[Series 1: us pelvis complete with transvaginal · 90 acquisitions, 15 frames shown]
[im 1/90]
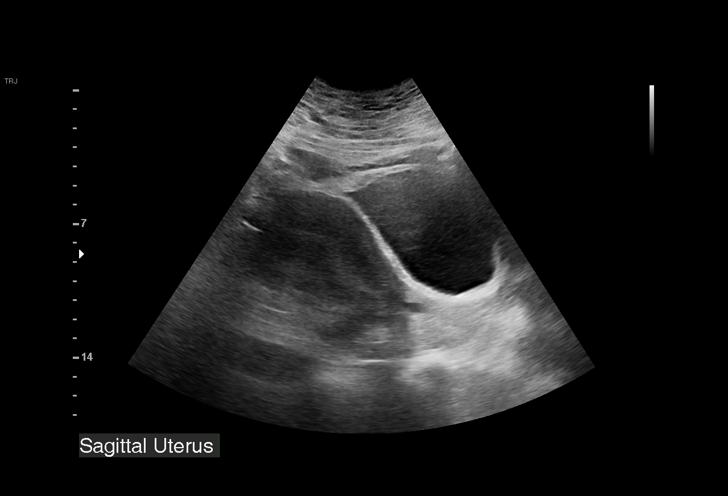
[im 8/90]
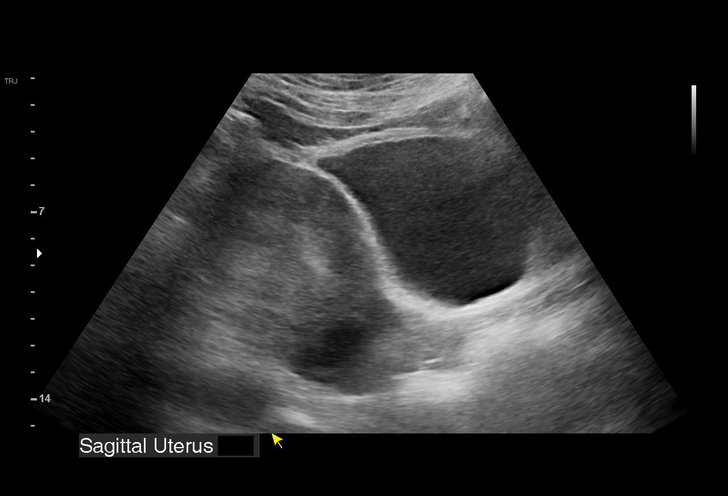
[im 15/90]
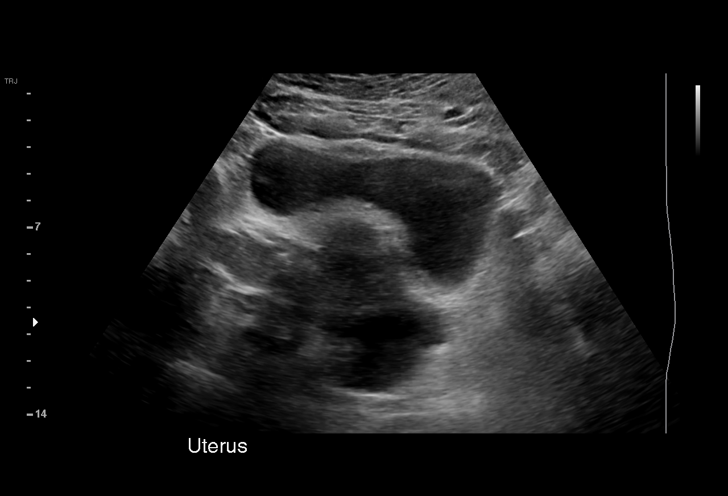
[im 19/90]
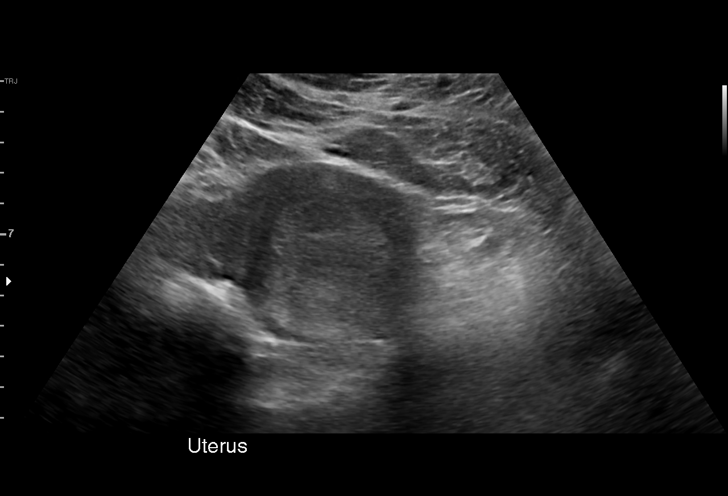
[im 26/90]
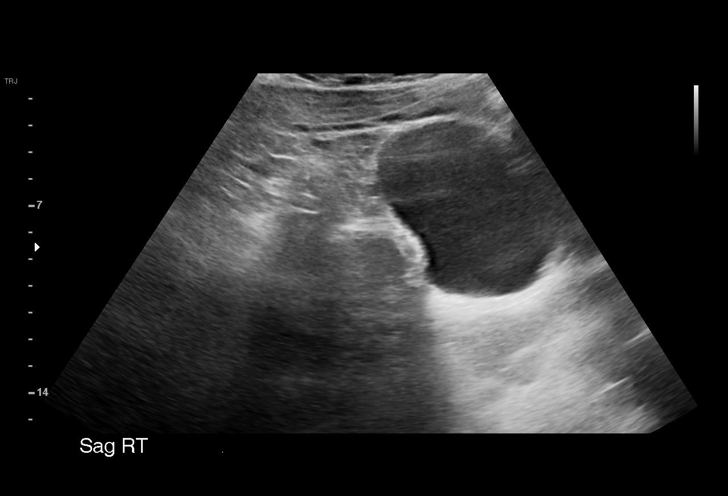
[im 34/90]
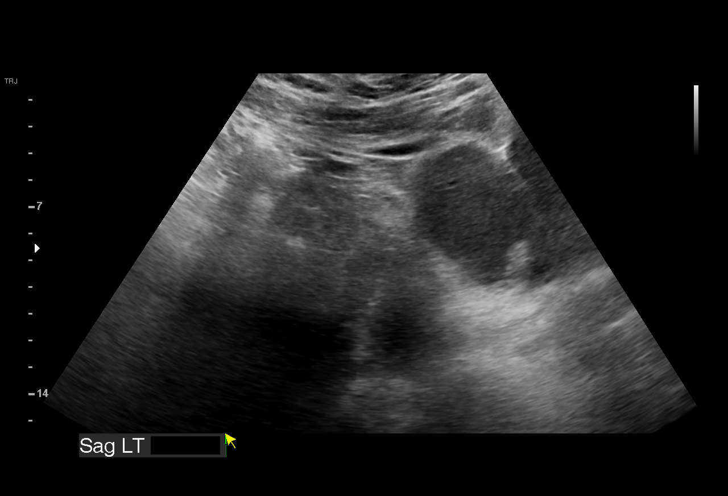
[im 38/90]
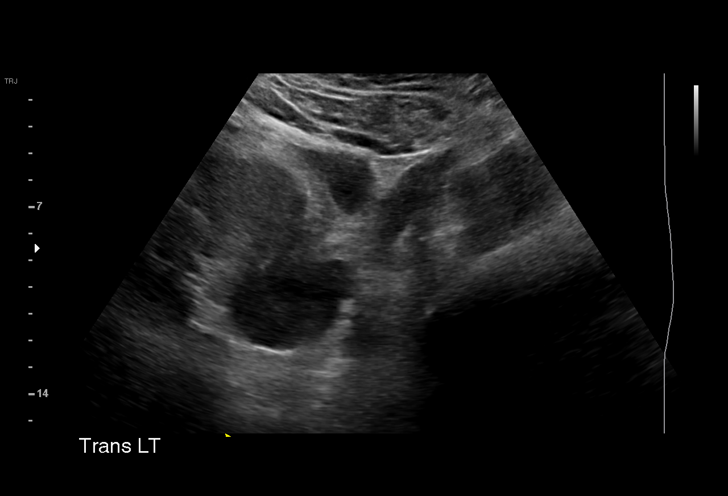
[im 45/90]
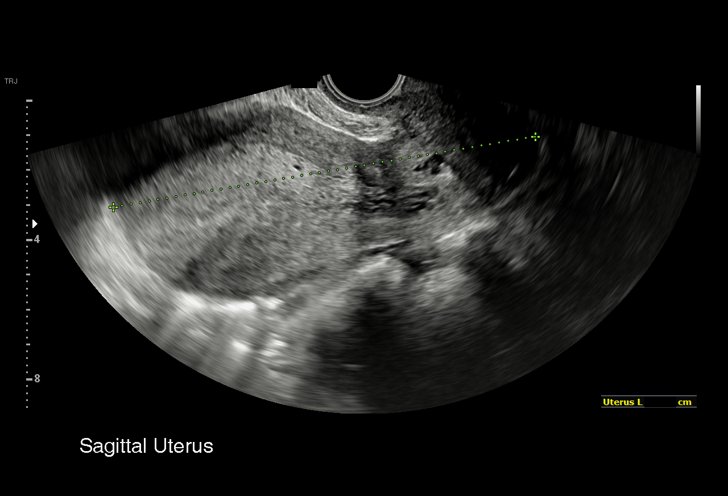
[im 52/90]
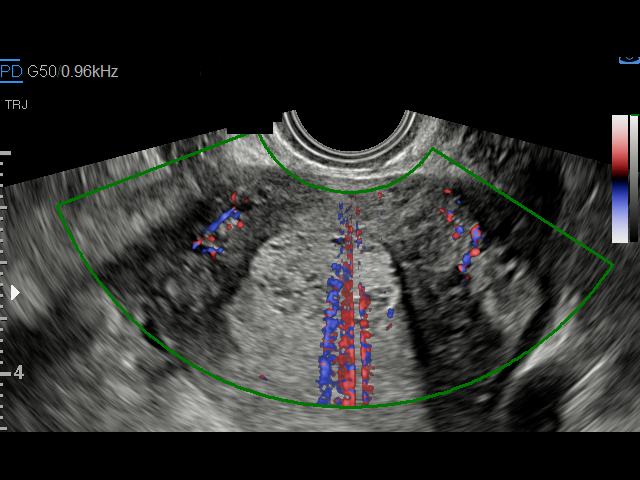
[im 56/90]
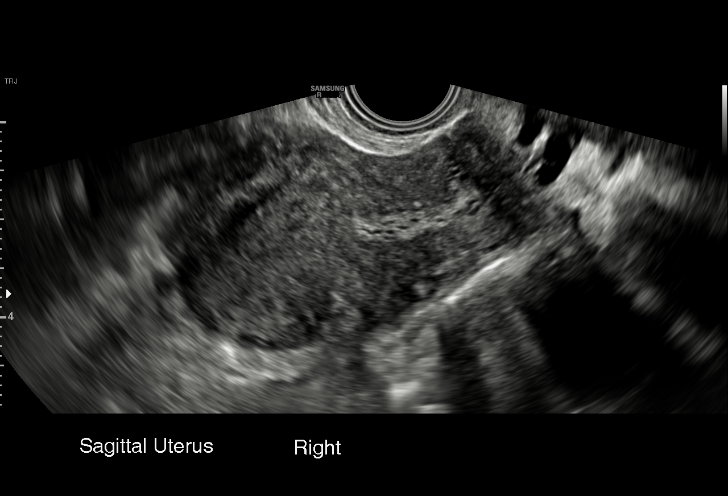
[im 64/90]
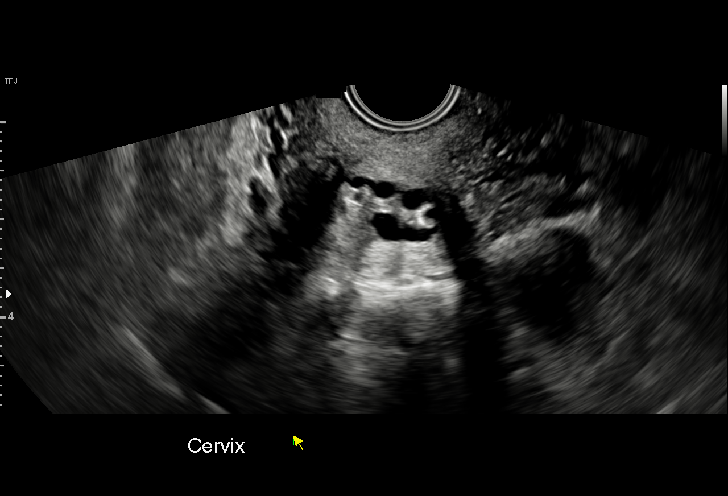
[im 71/90]
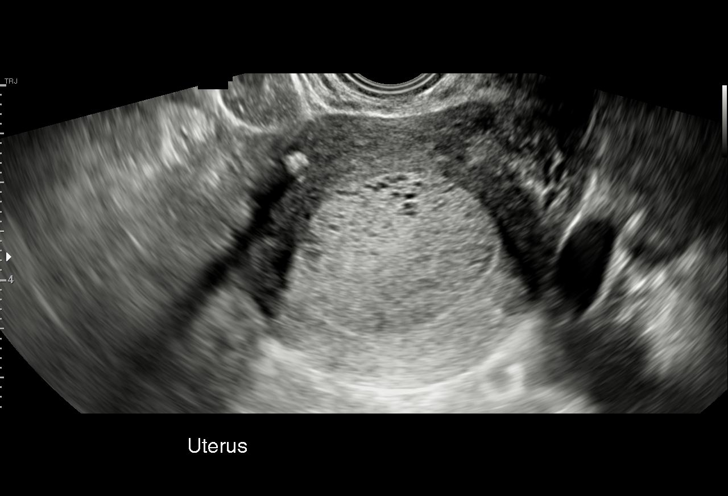
[im 75/90]
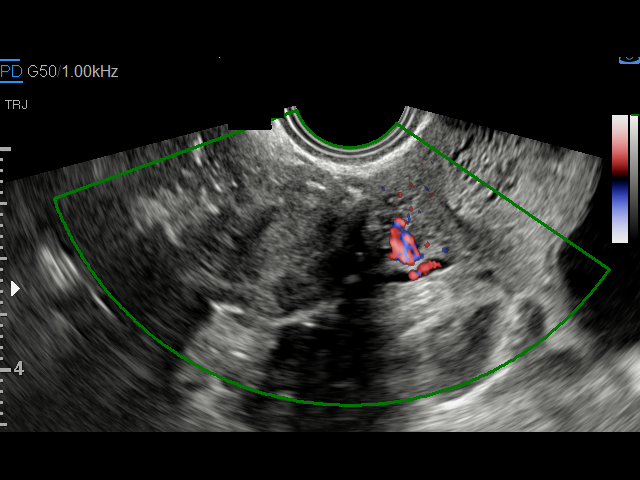
[im 82/90]
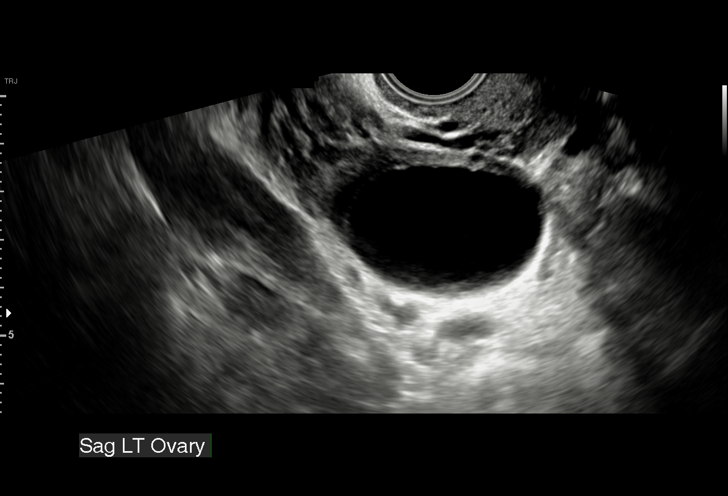
[im 90/90]
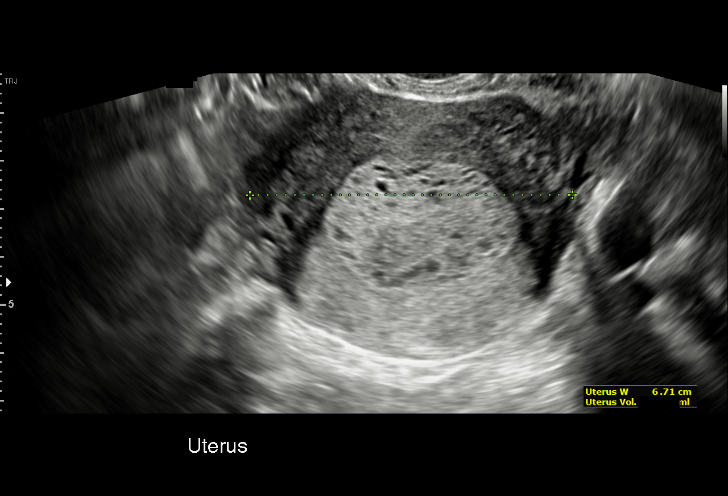

[15 of 25 positions shown; findings below may reference images not displayed]

FINDINGS: Uterus

Measurements: 12.9 x 5.6 x 6.2 cm = volume: 235 mL. Mildly
heterogeneous myometrium. Anteverted. Several small myometrial
calcifications seen. Probable small partially calcified leiomyoma at
lower uterine segment posteriorly 3.6 x 2.5 x 3.5 cm, suboptimally
visualized due to shadowing.

Endometrium

Thickness: 28 mm. Abnormal thickened and heterogeneous in
appearance. Scattered areas of internal hypervascularity on color
Doppler imaging. Multiple tiny cystic foci.

Right ovary

Measurements: 2.2 x 1.3 x 1.8 cm = volume: 2.8 mL. Normal morphology
without mass

Left ovary

Measurements: 5.4 x 2.3 x 2.2 cm = volume: 15.6 mL. Large mildly
complicated cyst with a thin septation, lesion overall measuring
x 2.9 x 3.6 cm.

Other findings

Small amount of nonspecific free pelvic fluid. No adnexal masses
otherwise seen.
IMPRESSION: LEFT ovarian cyst 4.6 cm greatest size mildly complicated by a
single thin septation.

Probable posterior leiomyoma of the lower uterine segment 3.6 cm
diameter, partially calcified.

Abnormal thickened endometrial complex 28 mm thick, heterogeneous
with scattered cystic foci in internal vascularity.

Correlation with a negative pregnancy test is recommended.

Endometrial thickness is considered abnormal; consider follow-up by
US in 6-8 weeks, during the week immediately following menses (exam
timing is critical).

## 2024-09-12 ENCOUNTER — Ambulatory Visit
Admission: RE | Admit: 2024-09-12 | Discharge: 2024-09-12 | Disposition: A | Discharge status: Home / Self Care | Source: Ambulatory Visit | Attending: Nurse Practitioner

## 2024-09-12 VITALS — BP 118/68 | HR 100 | Temp 98.2°F | Resp 14

## 2024-09-12 DIAGNOSIS — R8281 Pyuria: Secondary | ICD-10-CM

## 2024-09-12 DIAGNOSIS — Z202 Contact with and (suspected) exposure to infections with a predominantly sexual mode of transmission: Secondary | ICD-10-CM

## 2024-09-12 LAB — POCT URINE DIPSTICK
Bilirubin, UA: NEGATIVE
Blood, UA: NEGATIVE
Glucose, UA: NEGATIVE mg/dL
Ketones, POC UA: NEGATIVE mg/dL
Nitrite, UA: NEGATIVE
POC PROTEIN,UA: 30 — AB
Spec Grav, UA: 1.025 (ref 1.010–1.025)
Urobilinogen, UA: 0.2 U/dL
pH, UA: 6.5 (ref 5.0–8.0)

## 2024-09-12 LAB — POCT URINE PREGNANCY: Preg Test, Ur: NEGATIVE

## 2024-09-12 MED ORDER — DOXYCYCLINE HYCLATE 100 MG PO CAPS: 100.0000 mg | ORAL_CAPSULE | Freq: Two times a day (BID) | ORAL | 0 refills | Status: AC

## 2024-09-12 NOTE — Discharge Instructions (Addendum)
 You were seen today due to exposure to chlamydia and STD testing. Tests were performed today to check for bacteria, yeast, gonorrhea, chlamydia, and trichomonas. A culture of your urine has also been obtained. While results are pending, treatment has been initiated for chlamydia given your known exposure. It is important that you avoid any sexual activity until your test results have returned and your treatment is completed. You will only be contacted if any of your test results are positive and additional treatment is necessary. You can also review your results through your MyChart account.  Practice safe sex practices by wearing a condom every time you have sex. Remember that people who have STIs may not experience any symptoms. However, even without symptoms, these infections can be spread from person to person and require treatment. STIs can be treated, and many STIs can be cured. However, some STIs cannot be cured and will affect you for the rest of your life. It's important to be checked regularly for STIs.

## 2024-09-12 NOTE — ED Triage Notes (Signed)
 Pt presents for STD testing. States she had sexual partner tell her they were positive for Chlamydia.

## 2024-09-12 NOTE — ED Provider Notes (Signed)
 [facility]    CSN: [id number] Arrival date & time: 09/12/24  1334      History   Chief Complaint Chief Complaint  Patient presents with   SEXUALLY TRANSMITTED DISEASE    Treatable Sti testing - Entered by patient    HPI Loretta Leach is a 33 y.o. female.   Discussed the use of AI scribe software for clinical note transcription with the patient, who gave verbal consent to proceed.   Patient presents for STI testing and treatment after being notified yesterday that her sexual partner tested positive for chlamydia. The patient had sexual intercourse with this partner last Saturday and Sunday (November 29th and 30th), which was after her menstrual period that occurred from November 23rd through 28th. She reports having one sexual partner in the past three months, who is female. The patient denies current symptoms including vaginal odor, itching, irritation, pain or burning with urination, or genital or oral sores. She reports having her normal vaginal discharge but no abnormal discharge.   The following sections of the patient's history were reviewed and updated as appropriate: allergies, current medications, past family history, past medical history, past social history, past surgical history, and problem list.      Past Medical History:  Diagnosis Date   Anemia    Fibroids     Patient Active Problem List   Diagnosis Date Noted   S/P cesarean section 05/25/2016   Indication for care in labor or delivery 05/24/2016   Fibroids 05/15/2016   Iron deficiency anemia 04/09/2016   Anemia affecting pregnancy in third trimester 04/09/2016    Past Surgical History:  Procedure Laterality Date   CESAREAN SECTION N/A 05/25/2016   Procedure: CESAREAN SECTION;  Surgeon: Duwaine Blumenthal, DO;  Location: [facility];  Service: Obstetrics;  Laterality: N/A;   NO PAST SURGERIES      OB History     Gravida  2   Para  1   Term  1   Preterm      AB  1   Living  1       SAB  1   IAB      Ectopic      Multiple  0   Live Births  1            Home Medications    Prior to Admission medications   Medication Sig Start Date End Date Taking? Authorizing Provider  doxycycline  (VIBRAMYCIN ) 100 MG capsule Take 1 capsule (100 mg total) by mouth 2 (two) times daily for 7 days. 09/12/24 09/19/24 Yes Iola Lukes, FNP  Doxylamine -Pyridoxine  10-10 MG TBEC Take 1 tablet by mouth 2 (two) times daily. 10/05/20   Raford Lenis, MD  prenatal vitamin w/FE, FA (NATACHEW) 29-1 MG CHEW chewable tablet Chew 1 tablet by mouth daily at 12 noon. 10/05/20   Raford Lenis, MD  LO LOESTRIN FE  1 MG-10 MCG / 10 MCG tablet Take 1 tablet by mouth daily. 07/20/19 10/05/20  Constant, Peggy, MD    Family History Family History  Problem Relation Age of Onset   Endometriosis Mother     Social History Social History   Tobacco Use   Smoking status: Never   Smokeless tobacco: Never  Vaping Use   Vaping status: Never Used  Substance Use Topics   Alcohol use: No   Drug use: No     Allergies   Patient has no known allergies.   Review of Systems Review of Systems  HENT:  Negative  for mouth sores.   Genitourinary:  Negative for dysuria, genital sores and menstrual problem (LMP 11/23-11/28).       White, milky vaginal discharge present and unchanged from usual. No odor, itching, or irritation.   All other systems reviewed and are negative.    Physical Exam Triage Vital Signs ED Triage Vitals  Encounter Vitals Group     BP 09/12/24 1405 118/68     Girls Systolic BP Percentile --      Girls Diastolic BP Percentile --      Boys Systolic BP Percentile --      Boys Diastolic BP Percentile --      Pulse Rate 09/12/24 1405 100     Resp 09/12/24 1405 14     Temp 09/12/24 1405 98.2 F (36.8 C)     Temp Source 09/12/24 1405 Oral     SpO2 09/12/24 1405 96 %     Weight --      Height --      Head Circumference --      Peak Flow --      Pain Score 09/12/24  1411 0     Pain Loc --      Pain Education --      Exclude from Growth Chart --    No data found.  Updated Vital Signs BP 118/68 (BP Location: Right Arm)   Pulse 100   Temp 98.2 F (36.8 C) (Oral)   Resp 14   LMP 08/26/2024 (Approximate)   SpO2 96%   Breastfeeding No   Visual Acuity Right Eye Distance:   Left Eye Distance:   Bilateral Distance:    Right Eye Near:   Left Eye Near:    Bilateral Near:     Physical Exam Vitals reviewed.  Constitutional:      General: She is not in acute distress.    Appearance: Normal appearance. She is well-developed and normal weight. She is not ill-appearing, toxic-appearing or diaphoretic.  HENT:     Head: Normocephalic.     Nose: Nose normal.     Mouth/Throat:     Lips: Pink.     Mouth: Mucous membranes are moist.     Pharynx: Uvula midline. No pharyngeal swelling or uvula swelling.     Tonsils: No tonsillar exudate or tonsillar abscesses.  Cardiovascular:     Rate and Rhythm: Normal rate.  Pulmonary:     Effort: Pulmonary effort is normal. No tachypnea.     Breath sounds: Normal breath sounds and air entry. No decreased air movement. No decreased breath sounds.  Musculoskeletal:        General: Normal range of motion.     Cervical back: Full passive range of motion without pain, normal range of motion and neck supple.  Skin:    General: Skin is warm and dry.  Neurological:     General: No focal deficit present.     Mental Status: She is alert and oriented to person, place, and time.  Psychiatric:        Mood and Affect: Mood normal.        Behavior: Behavior normal. Behavior is cooperative.      UC Treatments / Results  Labs (all labs ordered are listed, but only abnormal results are displayed) Labs Reviewed  POCT URINE DIPSTICK - Abnormal; Notable for the following components:      Result Value   Color, UA other (*)    Clarity, UA cloudy (*)    POC PROTEIN,UA =  30 (*)    Leukocytes, UA Small (1+) (*)    All other  components within normal limits  URINE CULTURE  POCT URINE PREGNANCY  CERVICOVAGINAL ANCILLARY ONLY    EKG   Radiology No results found.  Procedures Procedures (including critical care time)  Medications Ordered in UC Medications - No data to display  Initial Impression / Assessment and Plan / UC Course  I have reviewed the triage vital signs and the nursing notes.  Pertinent labs & imaging results that were available during my care of the patient were reviewed by me and considered in my medical decision making (see chart for details).   The patient presents after confirmed exposure to chlamydia, reporting that her sexual partner tested positive yesterday, with last sexual contact occurring on November 29 and 30. She is currently asymptomatic, denying abnormal vaginal discharge beyond baseline, odor, itching, irritation, dysuria, or genital or oral lesions. She reports one sexual partner in the past three months. Urinalysis demonstrates mild cloudiness with trace leukocytes, which is most consistent with contamination or normal bacterial flora given the absence of urinary symptoms; a urine culture was sent for confirmation. A vaginal swab was obtained for comprehensive STI testing including chlamydia, gonorrhea, trichomonas, yeast, and bacterial vaginosis. Given the confirmed exposure, the patient was started on doxycycline  twice daily for 7 days and instructed to complete the full course regardless of test results. She was advised to abstain from sexual activity until treatment is completed and testing results return. Follow-up with her primary care provider or gynecologist is recommended for routine STI counseling and retesting as indicated. She was instructed to seek urgent or emergency care for development of pelvic or abdominal pain, fever, vomiting, abnormal vaginal bleeding, new or worsening discharge, painful urination, or any other concerning symptoms.  Today's evaluation has  revealed no signs of a dangerous process. Discussed diagnosis with patient and/or guardian. Patient and/or guardian aware of their diagnosis, possible red flag symptoms to watch out for and need for close follow up. Patient and/or guardian understands verbal and written discharge instructions. Patient and/or guardian comfortable with plan and disposition.  Patient and/or guardian has a clear mental status at this time, good insight into illness (after discussion and teaching) and has clear judgment to make decisions regarding their care  Documentation was completed with the aid of voice recognition software. Transcription may contain typographical errors.  Final Clinical Impressions(s) / UC Diagnoses   Final diagnoses:  Pyuria  Exposure to chlamydia     Discharge Instructions      You were seen today due to exposure to chlamydia and STD testing. Tests were performed today to check for bacteria, yeast, gonorrhea, chlamydia, and trichomonas. A culture of your urine has also been obtained. While results are pending, treatment has been initiated for chlamydia given your known exposure. It is important that you avoid any sexual activity until your test results have returned and your treatment is completed. You will only be contacted if any of your test results are positive and additional treatment is necessary. You can also review your results through your MyChart account.  Practice safe sex practices by wearing a condom every time you have sex. Remember that people who have STIs may not experience any symptoms. However, even without symptoms, these infections can be spread from person to person and require treatment. STIs can be treated, and many STIs can be cured. However, some STIs cannot be cured and will affect you for the rest of your life.  It's important to be checked regularly for STIs.      ED Prescriptions     Medication Sig Dispense Auth. Provider   doxycycline  (VIBRAMYCIN ) 100 MG  capsule Take 1 capsule (100 mg total) by mouth 2 (two) times daily for 7 days. 14 capsule Iola Lukes, FNP      PDMP not reviewed this encounter.   Iola Lukes, [place] 09/12/24 1505

## 2024-09-13 LAB — URINE CULTURE

## 2024-09-14 ENCOUNTER — Ambulatory Visit (HOSPITAL_COMMUNITY): Payer: Self-pay

## 2024-09-15 LAB — CERVICOVAGINAL ANCILLARY ONLY
Bacterial Vaginitis (gardnerella): POSITIVE — AB
Candida Glabrata: NEGATIVE
Candida Vaginitis: NEGATIVE
Chlamydia: POSITIVE — AB
Comment: NEGATIVE
Comment: NEGATIVE
Comment: NEGATIVE
Comment: NEGATIVE
Comment: NEGATIVE
Comment: NORMAL
Neisseria Gonorrhea: NEGATIVE
Trichomonas: NEGATIVE
